# Patient Record
Sex: Female | Born: 1975 | Race: White | Hispanic: No | Marital: Married | Smoking: Former smoker
Health system: Southern US, Community
[De-identification: ages and names within clinical notes are randomized; demographics above are authoritative.]

## PROBLEM LIST (undated history)

## (undated) ENCOUNTER — Inpatient Hospital Stay (HOSPITAL_COMMUNITY): Payer: Self-pay

## (undated) DIAGNOSIS — K219 Gastro-esophageal reflux disease without esophagitis: Secondary | ICD-10-CM

## (undated) DIAGNOSIS — Z8619 Personal history of other infectious and parasitic diseases: Secondary | ICD-10-CM

## (undated) DIAGNOSIS — G478 Other sleep disorders: Secondary | ICD-10-CM

## (undated) DIAGNOSIS — K449 Diaphragmatic hernia without obstruction or gangrene: Secondary | ICD-10-CM

## (undated) DIAGNOSIS — T4145XA Adverse effect of unspecified anesthetic, initial encounter: Secondary | ICD-10-CM

## (undated) DIAGNOSIS — S199XXA Unspecified injury of neck, initial encounter: Secondary | ICD-10-CM

## (undated) DIAGNOSIS — K589 Irritable bowel syndrome without diarrhea: Secondary | ICD-10-CM

## (undated) DIAGNOSIS — Z8742 Personal history of other diseases of the female genital tract: Secondary | ICD-10-CM

## (undated) DIAGNOSIS — D219 Benign neoplasm of connective and other soft tissue, unspecified: Secondary | ICD-10-CM

## (undated) DIAGNOSIS — T8859XA Other complications of anesthesia, initial encounter: Secondary | ICD-10-CM

## (undated) HISTORY — DX: Irritable bowel syndrome, unspecified: K58.9

## (undated) HISTORY — PX: TONSILLECTOMY: SUR1361

## (undated) HISTORY — DX: Diaphragmatic hernia without obstruction or gangrene: K44.9

## (undated) HISTORY — DX: Unspecified injury of neck, initial encounter: S19.9XXA

## (undated) HISTORY — DX: Personal history of other diseases of the female genital tract: Z87.42

## (undated) HISTORY — DX: Personal history of other infectious and parasitic diseases: Z86.19

---

## 1995-10-23 HISTORY — PX: WISDOM TOOTH EXTRACTION: SHX21

## 1998-10-22 HISTORY — PX: BACK SURGERY: SHX140

## 1999-10-23 DIAGNOSIS — D219 Benign neoplasm of connective and other soft tissue, unspecified: Secondary | ICD-10-CM

## 1999-10-23 HISTORY — PX: MYOMECTOMY ABDOMINAL APPROACH: SUR870

## 1999-10-23 HISTORY — DX: Benign neoplasm of connective and other soft tissue, unspecified: D21.9

## 2000-10-22 HISTORY — PX: BACK SURGERY: SHX140

## 2009-10-22 DIAGNOSIS — G478 Other sleep disorders: Secondary | ICD-10-CM

## 2009-10-22 DIAGNOSIS — S199XXA Unspecified injury of neck, initial encounter: Secondary | ICD-10-CM

## 2009-10-22 HISTORY — DX: Other sleep disorders: G47.8

## 2009-10-22 HISTORY — PX: NECK SURGERY: SHX720

## 2009-10-22 HISTORY — DX: Unspecified injury of neck, initial encounter: S19.9XXA

## 2011-06-06 LAB — HIV ANTIBODY (ROUTINE TESTING W REFLEX): HIV: NONREACTIVE

## 2011-06-06 LAB — HEPATITIS B SURFACE ANTIGEN: Hepatitis B Surface Ag: NEGATIVE

## 2011-06-06 LAB — GC/CHLAMYDIA PROBE AMP, GENITAL
Chlamydia: NEGATIVE
Gonorrhea: NEGATIVE

## 2011-06-06 LAB — ANTIBODY SCREEN: Antibody Screen: NEGATIVE

## 2011-12-24 ENCOUNTER — Encounter (INDEPENDENT_AMBULATORY_CARE_PROVIDER_SITE_OTHER): Payer: BC Managed Care – PPO | Admitting: Obstetrics and Gynecology

## 2011-12-24 ENCOUNTER — Encounter: Payer: Self-pay | Admitting: Obstetrics and Gynecology

## 2011-12-24 ENCOUNTER — Other Ambulatory Visit: Payer: Self-pay

## 2011-12-24 ENCOUNTER — Other Ambulatory Visit (INDEPENDENT_AMBULATORY_CARE_PROVIDER_SITE_OTHER): Payer: BC Managed Care – PPO

## 2011-12-24 DIAGNOSIS — Z331 Pregnant state, incidental: Secondary | ICD-10-CM

## 2012-01-21 ENCOUNTER — Encounter (INDEPENDENT_AMBULATORY_CARE_PROVIDER_SITE_OTHER): Payer: BC Managed Care – PPO | Admitting: Obstetrics and Gynecology

## 2012-01-21 DIAGNOSIS — Z3400 Encounter for supervision of normal first pregnancy, unspecified trimester: Secondary | ICD-10-CM

## 2012-02-04 ENCOUNTER — Ambulatory Visit (INDEPENDENT_AMBULATORY_CARE_PROVIDER_SITE_OTHER): Payer: BC Managed Care – PPO | Admitting: Registered Nurse

## 2012-02-04 ENCOUNTER — Other Ambulatory Visit: Payer: Self-pay | Admitting: Registered Nurse

## 2012-02-04 ENCOUNTER — Observation Stay (HOSPITAL_COMMUNITY)
Admission: AD | Admit: 2012-02-04 | Discharge: 2012-02-04 | Disposition: A | Discharge status: Home / Self Care | Payer: BC Managed Care – PPO | Source: Ambulatory Visit | Attending: Obstetrics and Gynecology | Admitting: Obstetrics and Gynecology

## 2012-02-04 ENCOUNTER — Ambulatory Visit (INDEPENDENT_AMBULATORY_CARE_PROVIDER_SITE_OTHER): Payer: BC Managed Care – PPO

## 2012-02-04 ENCOUNTER — Encounter: Payer: Self-pay | Admitting: Registered Nurse

## 2012-02-04 ENCOUNTER — Other Ambulatory Visit: Payer: Self-pay | Admitting: Obstetrics and Gynecology

## 2012-02-04 ENCOUNTER — Encounter (HOSPITAL_COMMUNITY): Payer: Self-pay | Admitting: *Deleted

## 2012-02-04 ENCOUNTER — Other Ambulatory Visit: Payer: BC Managed Care – PPO

## 2012-02-04 VITALS — BP 122/84 | Wt 216.0 lb

## 2012-02-04 DIAGNOSIS — O26849 Uterine size-date discrepancy, unspecified trimester: Secondary | ICD-10-CM

## 2012-02-04 DIAGNOSIS — O368390 Maternal care for abnormalities of the fetal heart rate or rhythm, unspecified trimester, not applicable or unspecified: Secondary | ICD-10-CM | POA: Insufficient documentation

## 2012-02-04 DIAGNOSIS — N979 Female infertility, unspecified: Secondary | ICD-10-CM

## 2012-02-04 DIAGNOSIS — O3429 Maternal care due to uterine scar from other previous surgery: Secondary | ICD-10-CM

## 2012-02-04 DIAGNOSIS — O4700 False labor before 37 completed weeks of gestation, unspecified trimester: Principal | ICD-10-CM | POA: Insufficient documentation

## 2012-02-04 DIAGNOSIS — IMO0002 Reserved for concepts with insufficient information to code with codable children: Secondary | ICD-10-CM

## 2012-02-04 DIAGNOSIS — O09529 Supervision of elderly multigravida, unspecified trimester: Secondary | ICD-10-CM

## 2012-02-04 DIAGNOSIS — O9989 Other specified diseases and conditions complicating pregnancy, childbirth and the puerperium: Secondary | ICD-10-CM

## 2012-02-04 DIAGNOSIS — O479 False labor, unspecified: Secondary | ICD-10-CM | POA: Diagnosis present

## 2012-02-04 HISTORY — DX: Adverse effect of unspecified anesthetic, initial encounter: T41.45XA

## 2012-02-04 HISTORY — DX: Other complications of anesthesia, initial encounter: T88.59XA

## 2012-02-04 HISTORY — DX: Gastro-esophageal reflux disease without esophagitis: K21.9

## 2012-02-04 HISTORY — DX: Other sleep disorders: G47.8

## 2012-02-04 HISTORY — DX: Benign neoplasm of connective and other soft tissue, unspecified: D21.9

## 2012-02-04 LAB — US OB FOLLOW UP

## 2012-02-04 MED ORDER — DOCUSATE SODIUM 100 MG PO CAPS: 100.0000 mg | ORAL_CAPSULE | Freq: Every day | ORAL | Status: DC

## 2012-02-04 MED ORDER — PRENATAL MULTIVITAMIN CH: 1.0000 | ORAL_TABLET | Freq: Every day | ORAL | Status: DC

## 2012-02-04 MED ORDER — CALCIUM CARBONATE ANTACID 500 MG PO CHEW: 2.0000 | CHEWABLE_TABLET | ORAL | Status: DC | PRN

## 2012-02-04 MED ORDER — ZOLPIDEM TARTRATE 10 MG PO TABS: 10.0000 mg | ORAL_TABLET | Freq: Every evening | ORAL | Status: DC | PRN

## 2012-02-04 MED ORDER — ACETAMINOPHEN 325 MG PO TABS: 650.0000 mg | ORAL_TABLET | ORAL | Status: DC | PRN

## 2012-02-04 NOTE — H&P (Signed)
  History   36 yo G1P0 at 43 3/7 weeks presented after low FHR noted during [place] at the office.  [place] was done for S>D.  FHR had increased by the end of [place] to 120-130, but patient was sent to [facility] for prolonged monitoring.  Reported some cramping over the weekend.  Denies leaking, bleeding, and reports +FM.  Last IC last night.  Pregnancy remarkable for; IVF pregnancy Hx myomectomy with endometrial entry--for delivery by C/S AMA--Quad screen WNL   OB History    Grav Para Term Preterm Abortions TAB SAB Ect Mult Living   1              Medical History: Infertility, with this pregnancy via IVF; Hx fibroids, with myomectomy 2001 with endometrial entry, with plan for C/S for delivery.  IBS; Hiatal hernia; GERD; Neck injury 2011  Family History: Mother varicosities, Father blood clot; Sister IDDM; Cousin epilepsy; MGF Parkinson's. PGM stroke.  Sister fibromyalgia. PGM skin.  Surgical History: Back surgery 2002; Tonsils age 65; Wisdom teeth 47.  IVF.    History  Substance Use Topics  . Smoking status: Former Games developer  . Smokeless tobacco: Not on file  . Alcohol Use: No    Allergies: Codeine--vomiting, Keflex--rash   Physical Exam   Blood pressure 122/84, weight 216 lb (97.977 kg).  Chest clear Heart RRR without murmur Abd gravid, NT Pelvic--cervix closed, posterior, long, vtx -2 Ext WNL  FHR reactive, baseline initially 115-125 with accels, now 120-130 with accels.  One segment of negative spontaneous CST. Series of contractions when toco readjusted, q 5 min prior to voiding, now q 8+ after voiding, 50 sec.  Patient unaware of contractions.  ED Course  IUP at 32 3/7 weeks Fetal bradycardia on [place], but reactive tracing and segment of negative spontaneous CST AMA Hx myomectomy, with delivery by C/S planned Uterine activity  Plan: Consulted with Dr. Su Hilt regarding reactive FHR, presence of contractions. Will d/c home on PTL precautions, to return to office this week for  FFN and NST on Thursday. Plan urine culture--patient just voided, will obtain in the office. GBS done.  Nigel Bridgeman, CNM, [place] 02/03/12 3:50pm

## 2012-02-04 NOTE — Discharge Summary (Signed)
Physician Discharge Summary  Patient ID: Heather Odonnell MRN: [id number] DOB/AGE: 07/14/1976 36 y.o.  Admit date: 02/04/2012 Discharge date: 02/04/2012  Admission Diagnoses:  Fetal bradycardia on [place]                                           Preterm uterine activity                                            Discharge Diagnoses:  Active Problems:  Preterm contractions Reactive FHR Preterm uterine activity without cervical change  Discharged Condition: stable  Hospital Course: Sent from office at 31 1/7 weeks due to FHR of 100s on [place], BPP 6/8 (-2 FBM).  Monitored for 2 hours, with very reactive FHR throughout.  Did have a series of UCs q 5 minutes, resolved to q 8+ after voiding.  Patient reported IC within 24 hours, so no FFN done.  Cervix was closed, long, vtx -2 on exam, with GBS done.  Patient was d/c'd home per consult with Dr. Su Hilt, with plan for FFN later this week with NST, then BPP with visit next week.  PTL precautions were reviewed with patient and husband.  Consults: None  Significant Diagnostic Studies: None  Treatments: None  Discharge Exam: Blood pressure 126/88, pulse 100, temperature 98.3 F (36.8 C), temperature source Oral, resp. rate 18, height 5\' 6"  (1.676 m), weight 216 lb (97.977 kg). General appearance: alert Chest wall: no tenderness GI: Abd gravid, NT Pelvic: Cervix closed, long, vtx -2 Extremities: extremities normal, atraumatic, no cyanosis or edema FHR reactive.  Disposition: Home   Follow-up:  Thursday, 4/18, 2013 for NST and FFN, urine culture.  Medication List  As of 02/04/2012  7:15 PM   TAKE these medications         betamethasone dipropionate 0.05 % cream   Commonly known as: DIPROLENE      loratadine 10 MG tablet   Commonly known as: CLARITIN   Take 10 mg by mouth at bedtime.      omeprazole 20 MG capsule   Commonly known as: PRILOSEC   Take 20 mg by mouth daily.      prenatal multivitamin Tabs   Take 1 tablet by mouth at  bedtime.           Follow-up Information    Follow up with [facility] on 02/07/2012. (Office will call to schedule visit for FFN and NST.)          Signed: Nigel Bridgeman 02/04/2012, 7:15 PM

## 2012-02-04 NOTE — Discharge Instructions (Signed)

## 2012-02-04 NOTE — Progress Notes (Signed)
Patient for u/s today re: s>d. U/S [facility] 03/15/2012. EFW 5 lbs. 6 oz. 85% tile. AFI 17.5 cm. Cx 3.08 cm BPP 6/8. Vertex presentation. Posterior grade 1 placenta. Normal amniotic fluid. Fetal heart rate slowed to 112bpm-119bpm. Time line bpm 2:29pm- 119 bpm, 3:01pm= 112bpm, 3:06= 135bpm, 3:07pm 154 bpm. BPP 6/8 in 30 minutes. Cx closed measured translabially. After consulting with AR patient to [facility]U for prolonged monitoring.Understanding of instructions verbalized.

## 2012-02-05 ENCOUNTER — Other Ambulatory Visit: Payer: Self-pay | Admitting: Obstetrics and Gynecology

## 2012-02-05 ENCOUNTER — Telehealth: Payer: Self-pay | Admitting: Obstetrics and Gynecology

## 2012-02-05 NOTE — Telephone Encounter (Signed)
TC to pt.  Informed of appts sched per VL.  Pt verbalizes comprehension.

## 2012-02-06 ENCOUNTER — Other Ambulatory Visit: Payer: Self-pay | Admitting: Obstetrics and Gynecology

## 2012-02-06 NOTE — Progress Notes (Signed)
Error

## 2012-02-06 NOTE — H&P (Deleted)
36 yo G1P0 at 32 3/7 weeks presented after low FHR noted during [place] at the office. [place] was done for S>D. FHR had increased by the end of [place] to 120-130, but patient was sent to [facility] for prolonged monitoring. Reported some cramping over the weekend. Denies leaking, bleeding, and reports +FM. Last IC last night.  Pregnancy remarkable for;  IVF pregnancy  Hx myomectomy with endometrial entry--for delivery by C/S  AMA--Quad screen WNL  OB History    Grav  Para  Term  Preterm  Abortions  TAB  SAB  Ect  Mult  Living    1              Medical History:  Infertility, with this pregnancy via IVF; Hx fibroids, with myomectomy 2001 with endometrial entry, with plan for C/S for delivery. IBS; Hiatal hernia; GERD; Neck injury 2011  Family History:  Mother varicosities, Father blood clot; Sister IDDM; Cousin epilepsy; MGF Parkinson's. PGM stroke. Sister fibromyalgia. PGM skin.  Surgical History:  Back surgery 2002; Tonsils age 28; Wisdom teeth 56. IVF.  History   Substance Use Topics   .  Smoking status:  Former Games developer   .  Smokeless tobacco:  Not on file   .  Alcohol Use:  No   Allergies: Codeine--vomiting, Keflex--rash  Physical Exam   Blood pressure 122/84, weight 216 lb (97.977 kg).  Chest clear  Heart RRR without murmur  Abd gravid, NT  Pelvic--cervix closed, posterior, long, vtx -2  Ext WNL  FHR reactive, baseline initially 115-125 with accels, now 120-130 with accels. One segment of negative spontaneous CST.  Series of contractions when toco readjusted, q 5 min prior to voiding, now q 8+ after voiding, 50 sec. Patient unaware of contractions.  ED Course   IUP at 32 3/7 weeks  Fetal bradycardia on [place], but reactive tracing and segment of negative spontaneous CST  AMA  Hx myomectomy, with delivery by C/S planned  Uterine activity  Plan:  Consulted with Dr. Su Hilt regarding reactive FHR, presence of contractions.  Will d/c home on PTL precautions, to return to office this week for FFN and  NST on Thursday.  Plan urine culture--patient just voided, will obtain in the office.  GBS done.  Nigel Bridgeman, CNM, [place]  02/03/12 3:50pm

## 2012-02-06 NOTE — H&P (Signed)
36 yo G1P0 at 32 3/7 weeks presented after low FHR noted during US at the office. US was done for S>D. FHR had increased by the end of US to 120-130, but [facility]. Reported some cramping over the weekend. Denies leaking, bleeding, and reports +FM. Last IC last night.  Pregnancy remarkable for;  IVF pregnancy  Hx myomectomy with endometrial entry--for delivery by C/S  AMA--Quad screen WNL  OB History    Grav  Para  Term  Preterm  Abortions  TAB  SAB  Ect  Mult  Living    1              Medical History:  Infertility, with this pregnancy via IVF; Hx fibroids, with myomectomy 2001 with endometrial entry, with plan for C/S for delivery. IBS; Hiatal hernia; GERD; Neck injury 2011  Family History:  Mother varicosities, Father blood clot; Sister IDDM; Cousin epilepsy; MGF Parkinson's. PGM stroke. Sister fibromyalgia. PGM skin.  Surgical History:  Back surgery 2002; Tonsils age 6; Wisdom teeth 1997. IVF.  History   Substance Use Topics   .  Smoking status:  Former Smoker   .  Smokeless tobacco:  Not on file   .  Alcohol Use:  No   Allergies: Codeine--vomiting, Keflex--rash  Physical Exam   Blood pressure 122/84, weight 216 lb (97.977 kg).  Chest clear  Heart RRR without murmur  Abd gravid, NT  Pelvic--cervix closed, posterior, long, vtx -2  Ext WNL  FHR reactive, baseline initially 115-125 with accels, now 120-130 with accels. One segment of negative spontaneous CST.  Series of contractions when toco readjusted, q 5 min prior to voiding, now q 8+ after voiding, 50 sec. Patient unaware of contractions.  ED Course   IUP at 32 3/7 weeks  Fetal bradycardia on US, but reactive tracing and segment of negative spontaneous CST  AMA  Hx myomectomy, with delivery by C/S planned  Uterine activity  Plan:  Consulted with Dr. Roberts regarding reactive FHR, presence of contractions.  Will d/c home on PTL precautions, to return to office this week for FFN and  NST on Thursday.  Plan urine culture--patient just voided, will obtain in the office.  GBS done.  Elysia Grand, CNM, [place]  02/03/12 3:50pm   

## 2012-02-07 ENCOUNTER — Encounter: Payer: Self-pay | Admitting: Obstetrics and Gynecology

## 2012-02-07 ENCOUNTER — Ambulatory Visit: Payer: BC Managed Care – PPO | Admitting: Obstetrics and Gynecology

## 2012-02-07 ENCOUNTER — Ambulatory Visit (INDEPENDENT_AMBULATORY_CARE_PROVIDER_SITE_OTHER): Payer: BC Managed Care – PPO | Admitting: Obstetrics and Gynecology

## 2012-02-07 VITALS — BP 120/84 | Wt 216.0 lb

## 2012-02-07 DIAGNOSIS — Z331 Pregnant state, incidental: Secondary | ICD-10-CM

## 2012-02-07 DIAGNOSIS — O4700 False labor before 37 completed weeks of gestation, unspecified trimester: Secondary | ICD-10-CM

## 2012-02-07 DIAGNOSIS — O2690 Pregnancy related conditions, unspecified, unspecified trimester: Secondary | ICD-10-CM

## 2012-02-07 LAB — CULTURE, BETA STREP (GROUP B ONLY)

## 2012-02-07 LAB — OB RESULTS CONSOLE GBS: GBS: NEGATIVE

## 2012-02-07 MED ORDER — CYCLOBENZAPRINE HCL 10 MG PO TABS: 10.0000 mg | ORAL_TABLET | Freq: Every evening | ORAL | Status: DC | PRN

## 2012-02-07 NOTE — Progress Notes (Signed)
Pt f/u from hospital visit 02/04/12.  NST reactive. FFN done.  C/o constant crampiness and back pain. Sleeping poorly because of hip pain.  Rec Flexeril Concerned that she won't know when labor starts and she needs a c-section to avoid labor.  Discussed the possibility of amniocentesis for lung maturity to plan csection earlier than 39 weeks.  Written info given. They will consider BPP NV

## 2012-02-07 NOTE — Patient Instructions (Signed)
Amniocentesis Amniocentesis (amnio) is the removal of a small amount of fluid that surrounds the baby in the amniotic sac. Once the fluid is removed, it may be examined to find answers to a number of serious questions. An amnio is often done early in pregnancy (between 37 and 17 weeks) to determine if there is a complication with the baby, or it is done later in pregnancy (between 77 and 37 weeks) to see if the baby's lungs are mature. Amnios that are done later in pregnancy are often done to help weigh the risks and benefits of a needed early delivery. Amnios done early in the second trimester are commonly referred to as genetic amnios, as they are typically done to check for a potential life-altering genetic abnormality. Rarely, an amnio is done to evaluate the amniotic fluid for concerns of infection. Amniocentesis may be done for other reasons, including:   If the mother is 58 years old or older. This is because of the increased risk of chromosome abnormalities, such as Down's syndrome or various chromosomal trisomies.   To determine any genetic problems.   To look for signs of infection in the uterus.   To determine if the baby's lungs are mature enough for the baby to survive outside of the uterus. This information is important in pregnancies when the baby must be delivered early.  LET YOUR CAREGIVER KNOW ABOUT:  Any complications you have had with the pregnancy, such as bleeding or contractions.   Allergies.   Medicines taken, including vitamins, herbs, eyedrops, over-the-counter medicines, and creams.   Use of steroids (by mouth or creams).   Previous problems with numbing medicines.   History of bleeding problems or blood clots.   Previous surgery.   Other health problems.  RISKS AND COMPLICATIONS  Complications can include:  Vaginal bleeding.   Transmission of an infection from mother to baby, such as hepatitis C or other viruses.   Leaking of amniotic fluid.    Premature labor.   Fetal injury.   Injury to the placenta.   Miscarriage (rare).  This procedure is done only if your caregiver feels the information obtained from the procedure justifies the risk. Amnios should not be performed before 15 weeks of pregnancy unless it is absolutely necessary. BEFORE THE PROCEDURE   Ask your caregiver any questions you may have.   Eat as usual.   Drink enough fluids to keep your urine clear or pale yellow.   You may want to arrange for someone to drive you home after the procedure.  PROCEDURE  A careful ultrasound is done to evaluate the baby for its position and for where the best pockets of fluid lie. The mother's abdomen is prepped with a solution to prevent infections. Often, a sterile ultrasound probe is used to watch the location of the amniocentesis needle being used. A small spot of the skin may be injected with a numbing medicine. In that location, a longer needle is introduced through the skin and down to the level of the baby. Amniotic fluid is removed into a syringe and sent to the lab for evaluation. AFTER THE PROCEDURE   Ask your caregiver if you need a RhoGam shot.   You will rest for 1 to 2 hours for observation.   Your baby will be placed on a monitor for 1 to 2 hours to see if there are any problems.   You may develop cramping and a small amount of vaginal bleeding right after the amnio.  Ask when your test results will be ready. Make sure you get your test results.  Document Released: 10/05/2000 Document Revised: 09/27/2011 Document Reviewed: 08/13/2011 [facility] Patient Information 2012 [place], [place].

## 2012-02-11 ENCOUNTER — Encounter (HOSPITAL_COMMUNITY): Payer: Self-pay | Admitting: *Deleted

## 2012-02-11 ENCOUNTER — Inpatient Hospital Stay (HOSPITAL_COMMUNITY)
Admission: AD | Admit: 2012-02-11 | Discharge: 2012-02-11 | Disposition: A | Discharge status: Home / Self Care | Payer: BC Managed Care – PPO | Source: Ambulatory Visit | Attending: Obstetrics and Gynecology | Admitting: Obstetrics and Gynecology

## 2012-02-11 ENCOUNTER — Telehealth: Payer: Self-pay | Admitting: Obstetrics and Gynecology

## 2012-02-11 DIAGNOSIS — M259 Joint disorder, unspecified: Secondary | ICD-10-CM

## 2012-02-11 DIAGNOSIS — O368390 Maternal care for abnormalities of the fetal heart rate or rhythm, unspecified trimester, not applicable or unspecified: Secondary | ICD-10-CM | POA: Insufficient documentation

## 2012-02-11 DIAGNOSIS — N979 Female infertility, unspecified: Secondary | ICD-10-CM

## 2012-02-11 DIAGNOSIS — O3429 Maternal care due to uterine scar from other previous surgery: Secondary | ICD-10-CM

## 2012-02-11 DIAGNOSIS — O09529 Supervision of elderly multigravida, unspecified trimester: Secondary | ICD-10-CM

## 2012-02-11 DIAGNOSIS — O9989 Other specified diseases and conditions complicating pregnancy, childbirth and the puerperium: Secondary | ICD-10-CM

## 2012-02-11 DIAGNOSIS — O479 False labor, unspecified: Secondary | ICD-10-CM

## 2012-02-11 DIAGNOSIS — M549 Dorsalgia, unspecified: Secondary | ICD-10-CM

## 2012-02-11 DIAGNOSIS — M899 Disorder of bone, unspecified: Secondary | ICD-10-CM

## 2012-02-11 DIAGNOSIS — IMO0002 Reserved for concepts with insufficient information to code with codable children: Secondary | ICD-10-CM

## 2012-02-11 LAB — URINALYSIS, ROUTINE W REFLEX MICROSCOPIC
Bilirubin Urine: NEGATIVE
Glucose, UA: NEGATIVE mg/dL
Ketones, ur: NEGATIVE mg/dL
Leukocytes, UA: NEGATIVE
Nitrite: NEGATIVE
Protein, ur: NEGATIVE mg/dL
Specific Gravity, Urine: <1.005 — ABNORMAL LOW (ref 1.005–1.030)
Urobilinogen, UA: 0.2 mg/dL (ref 0.0–1.0)
pH: 6 (ref 5.0–8.0)

## 2012-02-11 NOTE — Telephone Encounter (Signed)
PT CALLED, IS 33 WKS, HAD APPT LAST THURS AND HAD A LOT OF CRAMPING WHILE IN THE OFFICE, HAD A BPP AND WAS THEN SENT TO [facility], PT WAS ON MONITOR WHILE @ [facility], STRIP DID SHOW PT CTXS, WAS RX'D FLEXERIL, PT SAYS CRAMPING HAS CONTINUED THRU THE WKND, FLEXERIL ONLY LASTS 3-4 HRS THEN PAIN COMES BACK, TODAY PAIN ISGETTING WORSE, PAIN IS CONSTANT, PT DENIES BLDG/LOF, IS DRINKING LOTS OF H20, PER MK PT TO [facility] FOR EVAL.

## 2012-02-11 NOTE — Telephone Encounter (Deleted)
Routed to triage 

## 2012-02-11 NOTE — Discharge Instructions (Signed)
Back Pain in Pregnancy  Back pain during pregnancy is common. It happens in about half of all pregnancies. It is important for you and your baby that you remain active during your pregnancy.If you feel that back pain is not allowing you to remain active or sleep well, it is time to see your caregiver. Back pain may be caused by several factors related to changes during your pregnancy.Fortunately, unless you had trouble with your back before your pregnancy, the pain is likely to get better after you deliver.  Low back pain usually occurs between the fifth and seventh months of pregnancy. It can, however, happen in the first couple months. Factors that increase the risk of back problems include:    Previous back problems.   Injury to your back.   Having twins or multiple births.   A chronic cough.   Stress.   Job-related repetitive motions.   Muscle or spinal disease in the back.   Family history of back problems, ruptured (herniated) discs, or osteoporosis.   Depression, anxiety, and panic attacks.  CAUSES    When you are pregnant, your body produces a hormone called relaxin. This hormonemakes the ligaments connecting the low back and pubic bones more flexible. This flexibility allows the baby to be delivered more easily. When your ligaments are loose, your muscles need to work harder to support your back. Soreness in your back can come from tired muscles. Soreness can also come from back tissues that are irritated since they are receiving less support.   As the baby grows, it puts pressure on the nerves and blood vessels in your pelvis. This can cause back pain.   As the baby grows and gets heavier during pregnancy, the uterus pushes the stomach muscles forward and changes your center of gravity. This makes your back muscles work harder to maintain good posture.  SYMPTOMS   Lumbar pain during pregnancy  Lumbar pain during pregnancy usually occurs at or above the waist in the center of the back. There  may be pain and numbness that radiates into your leg or foot. This is similar to low back pain experienced by non-pregnant women. It usually increases with sitting for long periods of time, standing, or repetitive lifting. Tenderness may also be present in the muscles along your upper back.  Posterior pelvic pain during pregnancy  Pain in the back of the pelvis is more common than lumbar pain in pregnancy. It is a deep pain felt in your side at the waistline, or across the tailbone (sacrum), or in both places. You may have pain on one or both sides. This pain can also go into the buttocks and backs of the upper thighs. Pubic and groin pain may also be present. The pain does not quickly resolve with rest, and morning stiffness may also be present.  Pelvic pain during pregnancy can be brought on by most activities. A high level of fitness before and during pregnancy may or may not prevent this problem. Labor pain is usually 1 to 2 minutes apart, lasts for about 1 minute, and involves a bearing down feeling or pressure in your pelvis. However, if you are at term with the pregnancy, constant low back pain can be the beginning of early labor, and you should be aware of this.  DIAGNOSIS   X-rays of the back should not be done during the first 12 to 14 weeks of the pregnancy and only when absolutely necessary during the rest of the pregnancy. MRIs do   not give off radiation and are safe during pregnancy. MRIs also should only be done when absolutely necessary.  HOME CARE INSTRUCTIONS   Exercise as directed by your caregiver. Exercise is the most effective way to prevent or manage back pain. If you have a back problem, it is especially important to avoid sports that require sudden body movements. Swimming and walking are great activities.   Do not stand in one place for long periods of time.   Do not wear high heels.   Sit in chairs with good posture. Use a pillow on your lower back if necessary. Make sure your head  rests over your shoulders and is not hanging forward.   Try sleeping on your side, preferably the left side, with a pillow or two between your legs. If you are sore after a night's rest, your bedmay betoo soft.Try placing a board between your mattress and box spring.   Listen to your body when lifting.If you are experiencing pain, ask for help or try bending yourknees more so you can use your leg muscles rather than your back muscles. Squat down when picking up something from the floor. Do not bend over.   Eat a healthy diet. Try to gain weight within your caregiver's recommendations.   Use heat or cold packs 3 to 4 times a day for 15 minutes to help with the pain.   Only take over-the-counter or prescription medicines for pain, discomfort, or fever as directed by your caregiver.  Sudden (acute) back pain   Use bed rest for only the most extreme, acute episodes of back pain. Prolonged bed rest over 48 hours will aggravate your condition.   Ice is very effective for acute conditions.   Put ice in a plastic bag.   Place a towel between your skin and the bag.   Leave the ice on for 10 to 20 minutes every 2 hours, or as needed.   Using heat packs for 30 minutes prior to activities is also helpful.  Continued back pain  See your caregiver if you have continued problems. Your caregiver can help or refer you for appropriate physical therapy. With conditioning, most back problems can be avoided. Sometimes, a more serious issue may be the cause of back pain. You should be seen right away if new problems seem to be developing. Your caregiver may recommend:   A maternity girdle.   An elastic sling.   A back brace.   A massage therapist or acupuncture.  SEEK MEDICAL CARE IF:    You are not able to do most of your daily activities, even when taking the pain medicine you were given.   You need a referral to a physical therapist or chiropractor.   You want to try acupuncture.  SEEK IMMEDIATE MEDICAL CARE  IF:   You develop numbness, tingling, weakness, or problems with the use of your arms or legs.   You develop severe back pain that is no longer relieved with medicines.   You have a sudden change in bowel or bladder control.   You have increasing pain in other areas of the body.   You develop shortness of breath, dizziness, or fainting.   You develop nausea, vomiting, or sweating.   You have back pain which is similar to labor pains.   You have back pain along with your water breaking or vaginal bleeding.   You have back pain or numbness that travels down your leg.   Your back pain developed after   kidney stone.   You see blood in your urine. You may have a bladder infection or kidney stone.   You have back pain with blisters. You may have shingles.  Back pain is fairly common during pregnancy but should not be accepted as just part of the process. Back pain should always be treated as soon as possible. This will make your pregnancy as pleasant as possible. Document Released: 01/16/2006 Document Revised: 09/27/2011 Document Reviewed: 02/27/2011 Preterm Labor Preterm labor is when labor starts at less than 37 weeks of pregnancy. The normal length of a pregnancy is 39 to 41 weeks. CAUSES Often, there is no identifiable underlying cause as to why a woman goes into preterm labor. However, one of the most common known causes of preterm labor is infection. Infections of the uterus, cervix, vagina, amniotic sac, bladder, kidney, or even the lungs (pneumonia) can cause labor to start. Other causes of preterm labor include: Urogenital infections, such as yeast infections and bacterial vaginosis.  Uterine abnormalities (uterine shape, uterine septum, fibroids, bleeding from the placenta).  A cervix that has been operated on and opens prematurely.  Malformations in the baby.  Multiple  gestations (twins, triplets, and so on).  Breakage of the amniotic sac.  Additional risk factors for preterm labor include: Previous history of preterm labor.  Premature rupture of membranes (PROM).  A placenta that covers the opening of the cervix (placenta previa).  A placenta that separates from the uterus (placenta abruption).  A cervix that is too weak to hold the baby in the uterus (incompetence cervix).  Having too much fluid in the amniotic sac (polyhydramnios).  Taking illegal drugs or smoking while pregnant.  Not gaining enough weight while pregnant.  Women younger than 19 and older than 37 years old.  Low socioeconomic status.  African-American ethnicity.  SYMPTOMS Signs and symptoms of preterm labor include: Menstrual-like cramps.  Contractions that are 30 to 70 seconds apart, become very regular, closer together, and are more intense and painful.  Contractions that start on the top of the uterus and spread down to the lower abdomen and back.  A sense of increased pelvic pressure or back pain.  A watery or bloody discharge that comes from the vagina.  DIAGNOSIS  A diagnosis can be confirmed by: A vaginal exam.  An ultrasound of the cervix.  Sampling (swabbing) cervico-vaginal secretions. These samples can be tested for the presence of fetal fibronectin. This is a protein found in cervical discharge which is associated with preterm labor.  Fetal monitoring.  TREATMENT  Depending on the length of the pregnancy and other circumstances, a caregiver may suggest bed rest. If necessary, there are medicines that can be given to stop contractions and to quicken fetal lung maturity. If labor happens before 34 weeks of pregnancy, a prolonged hospital stay may be recommended. Treatment depends on the condition of both the mother and baby. PREVENTION There are some things a mother can do to lower the risk of preterm labor in future pregnancies. A woman can:  Stop smoking.  Maintain  healthy weight gain and avoid chemicals and drugs that are not necessary.  Be watchful for any type of infection.  Inform her caregiver if she has a known history of preterm labor.  Document Released: 12/29/2003 Document Revised: 09/27/2011 Document Reviewed: 02/02/2011 [facility] Patient Information 2012 [place], [place].

## 2012-02-11 NOTE — Progress Notes (Signed)
History    C/o of crampy back pain for  1 1/2 weeks relief x 3 hours with flexeril use at night makes me so sleepy, denies srom or vag bleeding with +FM. Hx back and neck surgery. No V,D, constipation or UTI s/s with some nausea drinking lots of water. +FM.  CSN: [id number]  Arrival date & time 02/11/12  1324   None     Chief Complaint  Patient presents with  . Abdominal Pain    HPI  Past Medical History  Diagnosis Date  . Difficulty waking 2011    difficulty waking up  . GERD (gastroesophageal reflux disease)   . Complication of anesthesia     trouble waking up at last surgery  . Fibroid 2001  . H/O varicella     Past Surgical History  Procedure Date  . Myomectomy abdominal approach 2001  . Tonsillectomy   . Neck surgery 2011  . Back surgery 2002    Family History  Problem Relation Age of Onset  . Diabetes Sister   . Cancer Paternal Grandmother   . Stroke Paternal Grandfather     History  Substance Use Topics  . Smoking status: Former Games developer  . Smokeless tobacco: Never Used  . Alcohol Use: No    OB History    Grav Para Term Preterm Abortions TAB SAB Ect Mult Living   1               Review of Systems  Allergies  Codeine and Keflex  Home Medications  No current outpatient prescriptions on file.  BP 119/77  Pulse 105  Temp(Src) 97.8 F (36.6 C) (Oral)  Resp 16  Ht 5\' 7"  (1.702 m)  Wt 215 lb (97.523 kg)  BMI 33.67 kg/m2  Physical Exam abd soft, gravid, nt, vag LTC Fhts NST reactive toco slight irritability Vag LTC Neg FFN 4/17  GBS neg  MAU Course  Procedures (including critical care time)  Labs Reviewed  URINALYSIS, ROUTINE W REFLEX MICROSCOPIC - Abnormal; Notable for the following:    APPearance HAZY (*)    Specific Gravity, Urine <1.005 (*)    Hgb urine dipstick TRACE (*)    All other components within normal limits  URINE MICROSCOPIC-ADD ON - Abnormal; Notable for the following:    Squamous Epithelial / LPF MANY (*)    All  other components within normal limits   No results found.   1. AMA (advanced maternal age) multigravida 35+   2. Pregnancy w/ hx of uterine myomectomy   3. Infertility, female, primary   4. Abnormal fetal heart rate or rhythm (FHR)   5. Preterm contractions   6. Chronic back pain    33 week IUP Back pain Not in PTL Hx of myomectomy for primary C/S P discussed with Dr. Estanislado Pandy per telephone flexeril q 6 hours, exercise for back pain, heat, ice, baby hugger belt discussed f/o as scheduled. Lavera Guise, CNM   MDM

## 2012-02-11 NOTE — MAU Note (Signed)
Constant cramp in abd, ongoing, getting worse.  Started a week ago Sat. Denies bleeding or leaking.  Nausea, diarrhea. Denies urinary problems.  No fever.

## 2012-02-13 NOTE — Telephone Encounter (Signed)
Routed to jackie  

## 2012-02-13 NOTE — Progress Notes (Signed)
Post ur discharge review completed.  

## 2012-02-13 NOTE — Telephone Encounter (Deleted)
Routed to angela frye

## 2012-02-15 ENCOUNTER — Telehealth: Payer: Self-pay

## 2012-02-15 ENCOUNTER — Encounter: Payer: Self-pay | Admitting: Obstetrics and Gynecology

## 2012-02-15 ENCOUNTER — Ambulatory Visit (INDEPENDENT_AMBULATORY_CARE_PROVIDER_SITE_OTHER): Payer: BC Managed Care – PPO

## 2012-02-15 ENCOUNTER — Ambulatory Visit (INDEPENDENT_AMBULATORY_CARE_PROVIDER_SITE_OTHER): Payer: BC Managed Care – PPO | Admitting: Obstetrics and Gynecology

## 2012-02-15 VITALS — BP 110/78 | Wt 218.0 lb

## 2012-02-15 DIAGNOSIS — O3429 Maternal care due to uterine scar from other previous surgery: Secondary | ICD-10-CM

## 2012-02-15 DIAGNOSIS — N979 Female infertility, unspecified: Secondary | ICD-10-CM

## 2012-02-15 DIAGNOSIS — O9989 Other specified diseases and conditions complicating pregnancy, childbirth and the puerperium: Secondary | ICD-10-CM

## 2012-02-15 DIAGNOSIS — O09529 Supervision of elderly multigravida, unspecified trimester: Secondary | ICD-10-CM

## 2012-02-15 NOTE — Progress Notes (Signed)
C/O lots of cramps  Last FFN negative 1 week ago  Improves with Flexeril No contractions No change in discharge BPP 8/8 AFI normal

## 2012-02-15 NOTE — Telephone Encounter (Signed)
Per pt request, and Dr. Cloretta Ned request, I faxed Rx for breast feeding supplies to [facility] at [phone]. A message had been routed to AR on 02/11/2012.Melody Comas A

## 2012-02-19 NOTE — Telephone Encounter (Signed)
I have no idea with this is about.  Please look into what they are needing and let me know. Thanks, AYR

## 2012-02-20 ENCOUNTER — Telehealth: Payer: Self-pay

## 2012-02-20 ENCOUNTER — Inpatient Hospital Stay (HOSPITAL_COMMUNITY): Admission: AD | Admit: 2012-02-20 | Payer: Self-pay | Source: Ambulatory Visit | Admitting: Obstetrics and Gynecology

## 2012-02-20 NOTE — Telephone Encounter (Signed)
Annice Pih, I thought you took care of this?

## 2012-02-20 NOTE — Telephone Encounter (Signed)
Spoke to pt. She states she got the Breast feeding supply kit in the mail from [facility]. Melody Comas A

## 2012-02-21 ENCOUNTER — Encounter: Payer: Self-pay | Admitting: Obstetrics and Gynecology

## 2012-02-21 ENCOUNTER — Ambulatory Visit (INDEPENDENT_AMBULATORY_CARE_PROVIDER_SITE_OTHER): Payer: BC Managed Care – PPO | Admitting: Obstetrics and Gynecology

## 2012-02-21 VITALS — BP 120/70 | Wt 221.0 lb

## 2012-02-21 DIAGNOSIS — Z3400 Encounter for supervision of normal first pregnancy, unspecified trimester: Secondary | ICD-10-CM

## 2012-02-21 DIAGNOSIS — O26849 Uterine size-date discrepancy, unspecified trimester: Secondary | ICD-10-CM

## 2012-02-21 NOTE — Progress Notes (Signed)
C/o left arm and ankle discomfort and LLE swelling BPP at [place] for S>D EFW in 2wks for S>D FKCs Bil Ted Hose Carpal Tunnel Syndrome - rec bil wrist splints Questions answered

## 2012-02-21 NOTE — Telephone Encounter (Signed)
This is sr pt 

## 2012-02-22 NOTE — Telephone Encounter (Signed)
LM for pt that breastfeeding supply rx needs to come from pediatricians office.  (per SR previous discussions)  Pt to call back if any further questions.  ld

## 2012-02-25 ENCOUNTER — Encounter (HOSPITAL_COMMUNITY): Payer: Self-pay | Admitting: *Deleted

## 2012-02-25 ENCOUNTER — Inpatient Hospital Stay (HOSPITAL_COMMUNITY)
Admission: AD | Admit: 2012-02-25 | Discharge: 2012-02-25 | Disposition: A | Discharge status: Home / Self Care | Payer: BC Managed Care – PPO | Source: Ambulatory Visit | Attending: Obstetrics and Gynecology | Admitting: Obstetrics and Gynecology

## 2012-02-25 ENCOUNTER — Inpatient Hospital Stay (HOSPITAL_COMMUNITY): Payer: BC Managed Care – PPO

## 2012-02-25 DIAGNOSIS — O479 False labor, unspecified: Secondary | ICD-10-CM

## 2012-02-25 DIAGNOSIS — N979 Female infertility, unspecified: Secondary | ICD-10-CM

## 2012-02-25 DIAGNOSIS — O09529 Supervision of elderly multigravida, unspecified trimester: Secondary | ICD-10-CM | POA: Insufficient documentation

## 2012-02-25 DIAGNOSIS — O368390 Maternal care for abnormalities of the fetal heart rate or rhythm, unspecified trimester, not applicable or unspecified: Secondary | ICD-10-CM | POA: Insufficient documentation

## 2012-02-25 DIAGNOSIS — O3429 Maternal care due to uterine scar from other previous surgery: Secondary | ICD-10-CM

## 2012-02-25 DIAGNOSIS — IMO0002 Reserved for concepts with insufficient information to code with codable children: Secondary | ICD-10-CM

## 2012-02-25 DIAGNOSIS — O4690 Antepartum hemorrhage, unspecified, unspecified trimester: Secondary | ICD-10-CM | POA: Insufficient documentation

## 2012-02-25 DIAGNOSIS — O4693 Antepartum hemorrhage, unspecified, third trimester: Secondary | ICD-10-CM

## 2012-02-25 NOTE — Discharge Instructions (Signed)
Preventing Preterm Labor Preterm labor is when a pregnant woman has contractions that cause the cervix to open, shorten, and thin before 37 weeks of pregnancy. You will have regular contractions (tightening) 2 to 3 minutes apart. This usually causes discomfort or pain. HOME CARE  Eat a healthy diet.   Take your vitamins as told by your doctor.   Drink enough fluids to keep your pee (urine) clear or pale yellow every day.   Get rest and sleep.   Do not have sex if you are at high risk for preterm labor.   Follow your doctor's advice about activity, medicines, and tests.   Avoid stress.   Avoid hard labor or exercise that lasts for a long time.   Do not smoke.  GET HELP RIGHT AWAY IF:   You are having contractions.   You have belly (abdominal) pain.   You have bleeding from your vagina.   You have pain when you pee (urinate).   You have abnormal discharge from your vagina.   You have a temperature by mouth above 102 F (38.9 C).  MAKE SURE YOU:  Understand these instructions.   Will watch your condition.   Will get help if you are not doing well or get worse.  Document Released: 01/04/2009 Document Revised: 09/27/2011 Document Reviewed: 01/04/2009 [employer] Patient Information 2012 [employer]. 

## 2012-02-25 NOTE — MAU Note (Signed)
TAKES FLEXERIL AND TYLENOL FOR CRAMPS.  HAD SEX ON SAT AT 5PM

## 2012-02-25 NOTE — MAU Note (Signed)
PT SAYS AT 0115  SHE GOT UP TO B-ROOM-  HAD A LARGE AMT BLEEDING.   IN TRIAGE -  PAD HAD  LIGHT RED - SMALL  STREAK OF BLOOD.

## 2012-02-25 NOTE — MAU Note (Signed)
FEELS CRAMPING- X4 WEEKS- TOLD IN OFFICE-  CERVIX CLOSED IN OFFICE.  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

## 2012-02-27 ENCOUNTER — Ambulatory Visit (INDEPENDENT_AMBULATORY_CARE_PROVIDER_SITE_OTHER): Payer: BC Managed Care – PPO

## 2012-02-27 VITALS — BP 100/70 | Wt 224.0 lb

## 2012-02-27 DIAGNOSIS — IMO0002 Reserved for concepts with insufficient information to code with codable children: Secondary | ICD-10-CM | POA: Insufficient documentation

## 2012-02-27 DIAGNOSIS — O26849 Uterine size-date discrepancy, unspecified trimester: Secondary | ICD-10-CM

## 2012-02-27 DIAGNOSIS — O4693 Antepartum hemorrhage, unspecified, third trimester: Secondary | ICD-10-CM | POA: Insufficient documentation

## 2012-02-27 DIAGNOSIS — O4690 Antepartum hemorrhage, unspecified, unspecified trimester: Secondary | ICD-10-CM

## 2012-02-27 NOTE — Progress Notes (Signed)
Addendum to 5/8 note:  U/s today for BPP:  AFI=17.6 (65%); BPP 8/8.  Vtx; Rt lat Plac Grade 1.  Cx=2.5 cm; closed (measured TL). Husband w/ concerns about transport to hospital since live in [place], [place] if spont labor before 39 weeks; labor s/s rev'd.

## 2012-02-27 NOTE — Progress Notes (Signed)
MAU early Mon morning 5/6 for Vaginal bleeding and u/s WNL and NST.  Pt reports no bleeding today or yesterday; spotty w/ wiping Mon after eval.  Pt on pelvic rest.  U/s w/ persisting LGA.  No PTL s/s.  GFM.  Husband at appt and involved and supportive.  Reports c/s not yet scheduled. Has appt already scheduled w/ Dr. Su Hilt next week w/ f/u u/s.  GBS [place].

## 2012-02-27 NOTE — Progress Notes (Signed)
Bleeding has subsided since 5/06 hospital visit Pt reqs new Flexeril rx

## 2012-03-06 ENCOUNTER — Telehealth: Payer: Self-pay | Admitting: Obstetrics and Gynecology

## 2012-03-06 ENCOUNTER — Ambulatory Visit (INDEPENDENT_AMBULATORY_CARE_PROVIDER_SITE_OTHER): Payer: BC Managed Care – PPO

## 2012-03-06 ENCOUNTER — Ambulatory Visit (INDEPENDENT_AMBULATORY_CARE_PROVIDER_SITE_OTHER): Payer: BC Managed Care – PPO | Admitting: Obstetrics and Gynecology

## 2012-03-06 VITALS — BP 110/62 | Temp 98.1°F | Wt 226.0 lb

## 2012-03-06 DIAGNOSIS — Z3490 Encounter for supervision of normal pregnancy, unspecified, unspecified trimester: Secondary | ICD-10-CM

## 2012-03-06 DIAGNOSIS — O26849 Uterine size-date discrepancy, unspecified trimester: Secondary | ICD-10-CM

## 2012-03-06 LAB — US OB FOLLOW UP

## 2012-03-06 NOTE — Telephone Encounter (Signed)
Pt called c/o spotting after using BR, w BRB initially sm amt and now is spotting. Was seen today per Dr. Su Hilt, had GBS, pelvic and [place]. WNL pt states cervix was 1/80. Pt is scheduled for primary C/S secondary to hx of myomectomy. States she was seen in for blding last week with no abnormal findings. She denies any ctx, has some mild cramping that is not new. +FM. Enc pt to lay day down with feet up, PO hydrate, FKC and CTO. Call if blding continues

## 2012-03-06 NOTE — Progress Notes (Signed)
No new complaints and no further spotting U/S 8lbs 2oz > 90th%, AFI 16cm, nl fluid, vtx, post placenta GBS today with consent and sensitivities 2 allergy to keflex Pt is sched for c/s on 03/25/12 FKCs and Labor Precautions RTO 1 wk Diet discussed

## 2012-03-10 ENCOUNTER — Encounter (HOSPITAL_COMMUNITY): Payer: Self-pay | Admitting: Pharmacist

## 2012-03-12 ENCOUNTER — Ambulatory Visit (INDEPENDENT_AMBULATORY_CARE_PROVIDER_SITE_OTHER): Payer: BC Managed Care – PPO | Admitting: Obstetrics and Gynecology

## 2012-03-12 VITALS — BP 114/84 | Ht 67.0 in | Wt 226.0 lb

## 2012-03-12 DIAGNOSIS — IMO0002 Reserved for concepts with insufficient information to code with codable children: Secondary | ICD-10-CM

## 2012-03-12 DIAGNOSIS — Z22330 Carrier of Group B streptococcus: Secondary | ICD-10-CM

## 2012-03-12 DIAGNOSIS — O09899 Supervision of other high risk pregnancies, unspecified trimester: Secondary | ICD-10-CM

## 2012-03-12 DIAGNOSIS — O09529 Supervision of elderly multigravida, unspecified trimester: Secondary | ICD-10-CM

## 2012-03-12 DIAGNOSIS — O9989 Other specified diseases and conditions complicating pregnancy, childbirth and the puerperium: Secondary | ICD-10-CM

## 2012-03-12 DIAGNOSIS — O3429 Maternal care due to uterine scar from other previous surgery: Secondary | ICD-10-CM

## 2012-03-12 DIAGNOSIS — O99820 Streptococcus B carrier state complicating pregnancy: Secondary | ICD-10-CM

## 2012-03-12 NOTE — Progress Notes (Signed)
Desires cx check today. 

## 2012-03-12 NOTE — Progress Notes (Signed)
GBS positive sensitivities pending

## 2012-03-13 LAB — CULTURE, BETA STREP (GROUP B ONLY)

## 2012-03-14 ENCOUNTER — Encounter (HOSPITAL_COMMUNITY): Payer: Self-pay | Admitting: *Deleted

## 2012-03-14 ENCOUNTER — Inpatient Hospital Stay (HOSPITAL_COMMUNITY): Payer: BC Managed Care – PPO | Admitting: Anesthesiology

## 2012-03-14 ENCOUNTER — Encounter (HOSPITAL_COMMUNITY)
Admission: AD | Disposition: A | Discharge status: Home / Self Care | Payer: Self-pay | Source: Ambulatory Visit | Attending: Obstetrics and Gynecology

## 2012-03-14 ENCOUNTER — Inpatient Hospital Stay (HOSPITAL_COMMUNITY)
Admission: AD | Admit: 2012-03-14 | Discharge: 2012-03-17 | DRG: 370 | Disposition: A | Discharge status: Home / Self Care | Payer: BC Managed Care – PPO | Source: Ambulatory Visit | Attending: Obstetrics and Gynecology | Admitting: Obstetrics and Gynecology

## 2012-03-14 ENCOUNTER — Encounter (HOSPITAL_COMMUNITY): Payer: Self-pay | Admitting: Anesthesiology

## 2012-03-14 DIAGNOSIS — O3490 Maternal care for abnormality of pelvic organ, unspecified, unspecified trimester: Principal | ICD-10-CM | POA: Diagnosis present

## 2012-03-14 DIAGNOSIS — O9903 Anemia complicating the puerperium: Secondary | ICD-10-CM | POA: Diagnosis not present

## 2012-03-14 DIAGNOSIS — O99892 Other specified diseases and conditions complicating childbirth: Secondary | ICD-10-CM | POA: Diagnosis present

## 2012-03-14 DIAGNOSIS — D649 Anemia, unspecified: Secondary | ICD-10-CM | POA: Diagnosis not present

## 2012-03-14 DIAGNOSIS — N979 Female infertility, unspecified: Secondary | ICD-10-CM

## 2012-03-14 DIAGNOSIS — O479 False labor, unspecified: Secondary | ICD-10-CM

## 2012-03-14 DIAGNOSIS — O09529 Supervision of elderly multigravida, unspecified trimester: Secondary | ICD-10-CM

## 2012-03-14 DIAGNOSIS — O3429 Maternal care due to uterine scar from other previous surgery: Secondary | ICD-10-CM

## 2012-03-14 DIAGNOSIS — O4693 Antepartum hemorrhage, unspecified, third trimester: Secondary | ICD-10-CM

## 2012-03-14 DIAGNOSIS — Z22330 Carrier of Group B streptococcus: Secondary | ICD-10-CM

## 2012-03-14 DIAGNOSIS — O09519 Supervision of elderly primigravida, unspecified trimester: Secondary | ICD-10-CM | POA: Diagnosis present

## 2012-03-14 DIAGNOSIS — IMO0002 Reserved for concepts with insufficient information to code with codable children: Secondary | ICD-10-CM

## 2012-03-14 LAB — CBC
Hemoglobin: 11.1 g/dL — ABNORMAL LOW (ref 12.0–15.0)
MCH: 29.8 pg (ref 26.0–34.0)
MCHC: 32.6 g/dL (ref 30.0–36.0)
MCV: 91.4 fL (ref 78.0–100.0)
Platelets: 248 10*3/uL (ref 150–400)

## 2012-03-14 SURGERY — Surgical Case
Anesthesia: Spinal

## 2012-03-14 MED ORDER — PROMETHAZINE HCL 25 MG/ML IJ SOLN: 6.2500 mg | INTRAMUSCULAR | Status: DC | PRN

## 2012-03-14 MED ORDER — SCOPOLAMINE 1 MG/3DAYS TD PT72
1.0000 | MEDICATED_PATCH | Freq: Once | TRANSDERMAL | Status: DC
  Administered 2012-03-14: 1.5 mg via TRANSDERMAL

## 2012-03-14 MED ORDER — FENTANYL CITRATE 0.05 MG/ML IJ SOLN
INTRAMUSCULAR | Status: DC | PRN
  Administered 2012-03-14: 25 ug via INTRATHECAL

## 2012-03-14 MED ORDER — TERBUTALINE SULFATE 1 MG/ML IJ SOLN
0.2500 mg | Freq: Once | INTRAMUSCULAR | Status: AC
  Administered 2012-03-14: 0.25 mg via SUBCUTANEOUS

## 2012-03-14 MED ORDER — FAMOTIDINE IN NACL 20-0.9 MG/50ML-% IV SOLN
20.0000 mg | Freq: Once | INTRAVENOUS | Status: AC
  Administered 2012-03-14: 20 mg via INTRAVENOUS

## 2012-03-14 MED ORDER — OXYTOCIN 10 UNIT/ML IJ SOLN
INTRAMUSCULAR | Status: AC
  Filled 2012-03-14: qty 2

## 2012-03-14 MED ORDER — KETOROLAC TROMETHAMINE 30 MG/ML IJ SOLN
30.0000 mg | Freq: Four times a day (QID) | INTRAMUSCULAR | Status: AC | PRN
  Administered 2012-03-14: 30 mg via INTRAVENOUS

## 2012-03-14 MED ORDER — MEPERIDINE HCL 25 MG/ML IJ SOLN: 6.2500 mg | INTRAMUSCULAR | Status: DC | PRN

## 2012-03-14 MED ORDER — CITRIC ACID-SODIUM CITRATE 334-500 MG/5ML PO SOLN
30.0000 mL | Freq: Once | ORAL | Status: AC
  Administered 2012-03-14: 30 mL via ORAL

## 2012-03-14 MED ORDER — CITRIC ACID-SODIUM CITRATE 334-500 MG/5ML PO SOLN
ORAL | Status: AC
  Administered 2012-03-14: 30 mL via ORAL
  Filled 2012-03-14: qty 15

## 2012-03-14 MED ORDER — FAMOTIDINE IN NACL 20-0.9 MG/50ML-% IV SOLN
INTRAVENOUS | Status: AC
  Administered 2012-03-14: 20 mg via INTRAVENOUS
  Filled 2012-03-14: qty 50

## 2012-03-14 MED ORDER — OXYTOCIN 20 UNITS IN LACTATED RINGERS INFUSION - SIMPLE
INTRAVENOUS | Status: DC | PRN
  Administered 2012-03-14: 20 [IU] via INTRAVENOUS

## 2012-03-14 MED ORDER — SCOPOLAMINE 1 MG/3DAYS TD PT72
MEDICATED_PATCH | TRANSDERMAL | Status: AC
  Administered 2012-03-14: 1.5 mg via TRANSDERMAL
  Filled 2012-03-14: qty 1

## 2012-03-14 MED ORDER — KETOROLAC TROMETHAMINE 30 MG/ML IJ SOLN
INTRAMUSCULAR | Status: AC
  Administered 2012-03-14: 30 mg via INTRAVENOUS
  Filled 2012-03-14: qty 1

## 2012-03-14 MED ORDER — TERBUTALINE SULFATE 1 MG/ML IJ SOLN
INTRAMUSCULAR | Status: AC
  Administered 2012-03-14: 0.25 mg via SUBCUTANEOUS
  Filled 2012-03-14: qty 1

## 2012-03-14 MED ORDER — FENTANYL CITRATE 0.05 MG/ML IJ SOLN
INTRAMUSCULAR | Status: AC
  Filled 2012-03-14: qty 2

## 2012-03-14 MED ORDER — ONDANSETRON HCL 4 MG/2ML IJ SOLN
INTRAMUSCULAR | Status: DC | PRN
  Administered 2012-03-14: 4 mg via INTRAVENOUS

## 2012-03-14 MED ORDER — ACETAMINOPHEN 325 MG PO TABS: 325.0000 mg | ORAL_TABLET | ORAL | Status: DC | PRN

## 2012-03-14 MED ORDER — ONDANSETRON HCL 4 MG/2ML IJ SOLN
INTRAMUSCULAR | Status: AC
  Filled 2012-03-14: qty 2

## 2012-03-14 MED ORDER — MORPHINE SULFATE 0.5 MG/ML IJ SOLN
INTRAMUSCULAR | Status: AC
  Filled 2012-03-14: qty 10

## 2012-03-14 MED ORDER — BUTORPHANOL TARTRATE 2 MG/ML IJ SOLN
1.0000 mg | INTRAMUSCULAR | Status: DC | PRN
  Administered 2012-03-14 (×2): 1 mg via INTRAVENOUS
  Filled 2012-03-14: qty 1

## 2012-03-14 MED ORDER — MORPHINE SULFATE (PF) 0.5 MG/ML IJ SOLN
INTRAMUSCULAR | Status: DC | PRN
  Administered 2012-03-14: .15 mg via EPIDURAL

## 2012-03-14 MED ORDER — LACTATED RINGERS IV SOLN
INTRAVENOUS | Status: DC | PRN
  Administered 2012-03-14 (×3): via INTRAVENOUS

## 2012-03-14 MED ORDER — MIDAZOLAM HCL 2 MG/2ML IJ SOLN: 0.5000 mg | Freq: Once | INTRAMUSCULAR | Status: DC | PRN

## 2012-03-14 MED ORDER — FENTANYL CITRATE 0.05 MG/ML IJ SOLN: 25.0000 ug | INTRAMUSCULAR | Status: DC | PRN

## 2012-03-14 MED ORDER — KETOROLAC TROMETHAMINE 30 MG/ML IJ SOLN: 30.0000 mg | Freq: Four times a day (QID) | INTRAMUSCULAR | Status: AC | PRN

## 2012-03-14 MED ORDER — GENTAMICIN SULFATE 40 MG/ML IJ SOLN
INTRAVENOUS | Status: AC
  Administered 2012-03-14: 100 mL via INTRAVENOUS
  Filled 2012-03-14: qty 2.5

## 2012-03-14 SURGICAL SUPPLY — 32 items
BENZOIN TINCTURE PRP APPL 2/3 (GAUZE/BANDAGES/DRESSINGS) ×2 IMPLANT
CHLORAPREP W/TINT 26ML (MISCELLANEOUS) ×2 IMPLANT
CLOTH BEACON ORANGE TIMEOUT ST (SAFETY) ×2 IMPLANT
CONTAINER PREFILL 10% NBF 15ML (MISCELLANEOUS) IMPLANT
DRAIN JACKSON PRT FLT 10 (DRAIN) IMPLANT
ELECT REM PT RETURN 9FT ADLT (ELECTROSURGICAL) ×2
ELECTRODE REM PT RTRN 9FT ADLT (ELECTROSURGICAL) ×1 IMPLANT
EVACUATOR SILICONE 100CC (DRAIN) IMPLANT
EXTRACTOR VACUUM M CUP 4 TUBE (SUCTIONS) IMPLANT
GLOVE BIO SURGEON STRL SZ 6.5 (GLOVE) ×2 IMPLANT
GLOVE BIOGEL PI IND STRL 7.0 (GLOVE) ×1 IMPLANT
GLOVE BIOGEL PI INDICATOR 7.0 (GLOVE) ×1
GOWN PREVENTION PLUS LG XLONG (DISPOSABLE) ×6 IMPLANT
KIT ABG SYR 3ML LUER SLIP (SYRINGE) IMPLANT
NEEDLE HYPO 25X5/8 SAFETYGLIDE (NEEDLE) IMPLANT
NS IRRIG 1000ML POUR BTL (IV SOLUTION) ×2 IMPLANT
PACK C SECTION WH (CUSTOM PROCEDURE TRAY) ×2 IMPLANT
RTRCTR C-SECT PINK 25CM LRG (MISCELLANEOUS) IMPLANT
SLEEVE SCD COMPRESS KNEE MED (MISCELLANEOUS) IMPLANT
STAPLER VISISTAT 35W (STAPLE) IMPLANT
STRIP CLOSURE SKIN 1/2X4 (GAUZE/BANDAGES/DRESSINGS) ×2 IMPLANT
SUT CHROMIC 0 CT 1 (SUTURE) ×2 IMPLANT
SUT MNCRL AB 3-0 PS2 27 (SUTURE) IMPLANT
SUT PLAIN 2 0 (SUTURE) ×2
SUT PLAIN 2 0 XLH (SUTURE) ×2 IMPLANT
SUT PLAIN ABS 2-0 CT1 27XMFL (SUTURE) ×2 IMPLANT
SUT SILK 2 0 SH (SUTURE) IMPLANT
SUT VIC AB 0 CTX 36 (SUTURE) ×4
SUT VIC AB 0 CTX36XBRD ANBCTRL (SUTURE) ×4 IMPLANT
TOWEL OR 17X24 6PK STRL BLUE (TOWEL DISPOSABLE) ×4 IMPLANT
TRAY FOLEY CATH 14FR (SET/KITS/TRAYS/PACK) ×2 IMPLANT
WATER STERILE IRR 1000ML POUR (IV SOLUTION) ×2 IMPLANT

## 2012-03-14 NOTE — Anesthesia Preprocedure Evaluation (Addendum)
Anesthesia Evaluation  Patient identified by MRN, date of birth, ID band Patient awake    Reviewed: Allergy & Precautions, H&P , NPO status , Patient's Chart, lab work & pertinent test results  History of Anesthesia Complications (+) PROLONGED EMERGENCE  Airway Mallampati: III      Dental No notable dental hx.    Pulmonary neg pulmonary ROS,  breath sounds clear to auscultation  Pulmonary exam normal       Cardiovascular Exercise Tolerance: Good negative cardio ROS  + dysrhythmias Rhythm:regular Rate:Normal     Neuro/Psych negative neurological ROS  negative psych ROS   GI/Hepatic negative GI ROS, Neg liver ROS, GERD-  ,  Endo/Other  negative endocrine ROSMorbid obesity  Renal/GU negative Renal ROS  negative genitourinary   Musculoskeletal   Abdominal Normal abdominal exam  (+)   Peds  Hematology negative hematology ROS (+)   Anesthesia Other Findings Difficulty waking 2011 difficulty waking up GERD (gastroesophageal reflux disease)        Complication of anesthesia   trouble waking up at last surgery Fibroid 2001      H/O varicella    Reproductive/Obstetrics (+) Pregnancy                          Anesthesia Physical Anesthesia Plan  ASA: III and Emergent  Anesthesia Plan: Spinal   Post-op Pain Management:    Induction:   Airway Management Planned:   Additional Equipment:   Intra-op Plan:   Post-operative Plan:   Informed Consent: I have reviewed the patients History and Physical, chart, labs and discussed the procedure including the risks, benefits and alternatives for the proposed anesthesia with the patient or authorized representative who has indicated his/her understanding and acceptance.     Plan Discussed with: Anesthesiologist, CRNA and Surgeon  Anesthesia Plan Comments:        Anesthesia Quick Evaluation

## 2012-03-14 NOTE — H&P (Signed)
Heather Odonnell is a 36 y.o. female presenting with SROM at 4pm, clear fluid, with bloody show, and UCs q 2-3 minutes.  She is scheduled for a primary C/S due to previous myomectomy.  Pregnancy remarkable for: Previous myomectomy--C/S planned. GBS positive. LGA baby--last EFW 8+ Hx infertility AMA  History of present pregnancy: Patient entered care at 10 weeks.  EDC of 03/30/12 was established by IVF date.  Anatomy scan was done at 18 weeks, with normal findings and an low lying placenta, that subsequently resolved.  She had an episode of questionable low heart rate, and was admitted overnight--fetal baseline was 100-120, with good variability. Further signficant events were an episode of bleeding in early May, now resolved.  GBS was positive, with sensitivity to clindamycin--patient allergic to Cephalexin. At her last evalution, she was 1 cm.  OB History    Grav Para Term Preterm Abortions TAB SAB Ect Mult Living   1              Past Medical History  Diagnosis Date  . Difficulty waking 2011    difficulty waking up  . GERD (gastroesophageal reflux disease)   . Complication of anesthesia     trouble waking up at last surgery  . Fibroid 2001  . H/O varicella    Past Surgical History  Procedure Date  . Myomectomy abdominal approach 2001  . Tonsillectomy   . Neck surgery 2011  . Back surgery 2002   Family History: family history includes Cancer in her paternal grandmother; Diabetes in her sister; and Stroke in her paternal grandfather. Social History:  reports that she has quit smoking. She has never used smokeless tobacco. She reports that she does not drink alcohol or use illicit drugs.  ROS:  Leaking clear fluid, UCs, positive FM.  Dilation: 1 Exam by:: Heather Odonnell, CNM 100%, vtx, -1, leaking clear fluid with small amount bloody show.  Temperature 97.1 F (36.2 C), temperature source Oral, height 5\' 7"  (1.702 m), weight 226 lb (102.513 kg), last menstrual period  06/24/2011. Chest clear Heart RRR without Abd gravid, NT between UCs Ext WNL  Prenatal labs: ABO, Rh: AB/Positive/-- (08/15 0000) Antibody: Negative (08/15 0000) Rubella:  Immune RPR: Nonreactive (08/15 0000)  HBsAg: Negative (08/15 0000)  HIV: Non-reactive (08/15 0000)  GBS: Negative (04/18 0000)  Quad screen WNL.  Assessment/Plan: IUP at 37 5/7 weeks SROM, early labor Planned C/S due to myomectomy GBS positive Allergy to Cephalexin  Plan: Admitted to [facility] per consult with Dr. Normand Sloop Routine pre-op orders May give Stadol for comfort after consented, if OR start time delayed.. R&B C/S reviewed with patient, including bleeding, infection, damage to other organs--patient and husband seem to understand these risks and wish to proceed.  Heather Odonnell 03/14/2012, 5:57 PM

## 2012-03-14 NOTE — Transfer of Care (Signed)
Immediate Anesthesia Transfer of Care Note  Patient: Heather Odonnell  Procedure(s) Performed: Procedure(s) (LRB): CESAREAN SECTION (N/A)  Patient Location: PACU  Anesthesia Type: Spinal  Level of Consciousness: awake  Airway & Oxygen Therapy: Patient Spontanous Breathing  Post-op Assessment: Report given to PACU RN  Post vital signs: Reviewed and stable  Complications: No apparent anesthesia complications

## 2012-03-14 NOTE — Anesthesia Postprocedure Evaluation (Signed)
  Anesthesia Post-op Note  Patient: Heather Odonnell  Procedure(s) Performed: Procedure(s) (LRB): CESAREAN SECTION (N/A)  Patient is awake, responsive, moving her legs, and has signs of resolution of her numbness. Pain and nausea are reasonably well controlled. Vital signs are stable and clinically acceptable. Oxygen saturation is clinically acceptable. There are no apparent anesthetic complications at this time. Patient is ready for discharge.

## 2012-03-14 NOTE — MAU Note (Signed)
Pt in for labor eval, is scheduled for c/s due to hx of myomectomy.  Reports srom at 1600.  Denies any bleeding.  + FM.

## 2012-03-14 NOTE — Op Note (Signed)
Cesarean Section Procedure Note   Heather Odonnell  03/14/2012  Indications: Scheduled Proceedure/Maternal Request and due to previous myomectomy and SROM with Labor   Pre-operative Diagnosis: previous myomectomy.   Post-operative Diagnosis: Same   Surgeon: Surgeon(s) and Role:    * Michael Litter, MD - Primary   Assistants: Manfred Arch CNM  Anesthesia: spinal   Procedure Details:  The patient was seen in the Holding Room. The risks, benefits, complications, treatment options, and expected outcomes were discussed with the patient. The patient concurred with the proposed plan, giving informed consent. identified as TALINE NASS and the procedure verified as C-Section Delivery. A Time Out was held and the above information confirmed.  After induction of anesthesia, the patient was draped and prepped in the usual sterile manner. A transverse incision was made and carried down through the subcutaneous tissue to the fascia. Fascial incision was made in the midline and extended transversely. The fascia was separated from the underlying rectus muscle superiorly and inferiorly. The peritoneum was identified and entered. Peritoneal incision was extended longitudinally with good visualization of bowel and bladder. The alexsis retractor was placed in the abdomen.The utero-vesical peritoneal reflection was incised transversely and the bladder flap was bluntly freed from the lower uterine segment. A low transverse uterine incision was made. Delivered from cephalic presentation was a  pound infant, with Apgar scores of 8 at one minute and 9 at five minutes. Cord ph was not sent the umbilical cord was clamped and cut cord blood was obtained for evaluation. The placenta was removed Intact and appeared normal. The uterine outline, tubes and ovaries appeared normal}. The uterine incision was closed with running locked sutures of 0Vicryl. A second layer 0 vicrlyl was used to imbricate the uterine incision     Hemostasis was observed. Lavage was carried out until clear. The alexsis was removed.   The fascia was then reapproximated with running sutures of 0vicrly.  The subcutaneous tissue was reapproximated using interrupted suture of 0 plain gut. The subcuticular closure was performed using 3-55monocryl     Instrument, sponge, and needle counts were correct prior the abdominal closure and were correct at the conclusion of the case.    Findings: nmale infant in vtx presentation.  With apgars of 8,9.  Clear fluid noted   Estimated Blood Loss:  Total IV Fluids:   Urine Output: 400CC OF clear urine  Specimens: placenta to pathology  Complications: no complications  Disposition: PACU - hemodynamically stable.   Maternal Condition: stable   Baby condition / location:  nursery-stable  Attending Attestation: I was present and scrubbed for the entire procedure.   Signed: Surgeon(s): Michael Litter, MD

## 2012-03-14 NOTE — Anesthesia Procedure Notes (Signed)

## 2012-03-15 LAB — CBC
HCT: 29.4 % — ABNORMAL LOW (ref 36.0–46.0)
MCV: 91.6 fL (ref 78.0–100.0)
RBC: 3.21 MIL/uL — ABNORMAL LOW (ref 3.87–5.11)
WBC: 15.6 10*3/uL — ABNORMAL HIGH (ref 4.0–10.5)

## 2012-03-15 LAB — RPR: RPR Ser Ql: NONREACTIVE

## 2012-03-15 MED ORDER — DIPHENHYDRAMINE HCL 25 MG PO CAPS: 25.0000 mg | ORAL_CAPSULE | Freq: Four times a day (QID) | ORAL | Status: DC | PRN

## 2012-03-15 MED ORDER — FERROUS SULFATE 325 (65 FE) MG PO TABS: 325.0000 mg | ORAL_TABLET | Freq: Two times a day (BID) | ORAL | Status: DC

## 2012-03-15 MED ORDER — IBUPROFEN 600 MG PO TABS
600.0000 mg | ORAL_TABLET | Freq: Four times a day (QID) | ORAL | Status: DC
  Administered 2012-03-15 – 2012-03-17 (×10): 600 mg via ORAL
  Filled 2012-03-15 (×10): qty 1

## 2012-03-15 MED ORDER — OXYTOCIN 20 UNITS IN LACTATED RINGERS INFUSION - SIMPLE: 125.0000 mL/h | INTRAVENOUS | Status: DC

## 2012-03-15 MED ORDER — ZOLPIDEM TARTRATE 5 MG PO TABS: 5.0000 mg | ORAL_TABLET | Freq: Every evening | ORAL | Status: DC | PRN

## 2012-03-15 MED ORDER — HYDROCODONE-ACETAMINOPHEN 5-325 MG PO TABS
1.0000 | ORAL_TABLET | ORAL | Status: DC | PRN
  Administered 2012-03-15: 1 via ORAL
  Administered 2012-03-16: 2 via ORAL
  Administered 2012-03-16 – 2012-03-17 (×4): 1 via ORAL
  Administered 2012-03-17 (×2): 2 via ORAL
  Filled 2012-03-15: qty 2
  Filled 2012-03-15 (×2): qty 1
  Filled 2012-03-15: qty 2
  Filled 2012-03-15 (×2): qty 1
  Filled 2012-03-15: qty 2
  Filled 2012-03-15: qty 1

## 2012-03-15 MED ORDER — DIBUCAINE 1 % RE OINT: 1.0000 "application " | TOPICAL_OINTMENT | RECTAL | Status: DC | PRN

## 2012-03-15 MED ORDER — NALBUPHINE HCL 10 MG/ML IJ SOLN
5.0000 mg | INTRAMUSCULAR | Status: DC | PRN
  Filled 2012-03-15: qty 1

## 2012-03-15 MED ORDER — MENTHOL 3 MG MT LOZG: 1.0000 | LOZENGE | OROMUCOSAL | Status: DC | PRN

## 2012-03-15 MED ORDER — ACETAMINOPHEN 10 MG/ML IV SOLN
1000.0000 mg | Freq: Four times a day (QID) | INTRAVENOUS | Status: AC | PRN
  Filled 2012-03-15: qty 100

## 2012-03-15 MED ORDER — DIPHENHYDRAMINE HCL 25 MG PO CAPS: 25.0000 mg | ORAL_CAPSULE | ORAL | Status: DC | PRN

## 2012-03-15 MED ORDER — WITCH HAZEL-GLYCERIN EX PADS: 1.0000 "application " | MEDICATED_PAD | CUTANEOUS | Status: DC | PRN

## 2012-03-15 MED ORDER — LORATADINE 10 MG PO TABS
10.0000 mg | ORAL_TABLET | Freq: Every day | ORAL | Status: DC
  Administered 2012-03-15 – 2012-03-16 (×2): 10 mg via ORAL
  Filled 2012-03-15 (×3): qty 1

## 2012-03-15 MED ORDER — PSEUDOEPHEDRINE HCL ER 120 MG PO TB12
120.0000 mg | ORAL_TABLET | Freq: Two times a day (BID) | ORAL | Status: DC | PRN
  Administered 2012-03-15 – 2012-03-16 (×2): 120 mg via ORAL
  Filled 2012-03-15 (×2): qty 1

## 2012-03-15 MED ORDER — SODIUM CHLORIDE 0.9 % IJ SOLN: 3.0000 mL | INTRAMUSCULAR | Status: DC | PRN

## 2012-03-15 MED ORDER — ONDANSETRON HCL 4 MG PO TABS: 4.0000 mg | ORAL_TABLET | ORAL | Status: DC | PRN

## 2012-03-15 MED ORDER — LACTATED RINGERS IV SOLN: INTRAVENOUS | Status: DC

## 2012-03-15 MED ORDER — MEASLES, MUMPS & RUBELLA VAC ~~LOC~~ INJ
0.5000 mL | INJECTION | Freq: Once | SUBCUTANEOUS | Status: DC
  Filled 2012-03-15: qty 0.5

## 2012-03-15 MED ORDER — SIMETHICONE 80 MG PO CHEW: 80.0000 mg | CHEWABLE_TABLET | ORAL | Status: DC | PRN

## 2012-03-15 MED ORDER — BISACODYL 10 MG RE SUPP: 10.0000 mg | Freq: Every day | RECTAL | Status: DC | PRN

## 2012-03-15 MED ORDER — FERROUS SULFATE 325 (65 FE) MG PO TABS
325.0000 mg | ORAL_TABLET | Freq: Two times a day (BID) | ORAL | Status: DC
  Administered 2012-03-15 – 2012-03-17 (×5): 325 mg via ORAL
  Filled 2012-03-15 (×6): qty 1

## 2012-03-15 MED ORDER — METOCLOPRAMIDE HCL 5 MG/ML IJ SOLN: 10.0000 mg | Freq: Three times a day (TID) | INTRAMUSCULAR | Status: DC | PRN

## 2012-03-15 MED ORDER — LANOLIN HYDROUS EX OINT: 1.0000 "application " | TOPICAL_OINTMENT | CUTANEOUS | Status: DC | PRN

## 2012-03-15 MED ORDER — DIPHENHYDRAMINE HCL 50 MG/ML IJ SOLN: 25.0000 mg | INTRAMUSCULAR | Status: DC | PRN

## 2012-03-15 MED ORDER — NALOXONE HCL 0.4 MG/ML IJ SOLN
1.0000 ug/kg/h | INTRAMUSCULAR | Status: DC | PRN
  Filled 2012-03-15: qty 2.5

## 2012-03-15 MED ORDER — PRENATAL MULTIVITAMIN CH
1.0000 | ORAL_TABLET | Freq: Every day | ORAL | Status: DC
  Administered 2012-03-15 – 2012-03-17 (×3): 1 via ORAL
  Filled 2012-03-15 (×3): qty 1

## 2012-03-15 MED ORDER — ONDANSETRON HCL 4 MG/2ML IJ SOLN: 4.0000 mg | INTRAMUSCULAR | Status: DC | PRN

## 2012-03-15 MED ORDER — SENNOSIDES-DOCUSATE SODIUM 8.6-50 MG PO TABS
2.0000 | ORAL_TABLET | Freq: Every day | ORAL | Status: DC
  Administered 2012-03-15 – 2012-03-16 (×2): 2 via ORAL

## 2012-03-15 MED ORDER — FLEET ENEMA 7-19 GM/118ML RE ENEM: 1.0000 | ENEMA | Freq: Every day | RECTAL | Status: DC | PRN

## 2012-03-15 MED ORDER — ONDANSETRON HCL 4 MG/2ML IJ SOLN: 4.0000 mg | Freq: Three times a day (TID) | INTRAMUSCULAR | Status: DC | PRN

## 2012-03-15 MED ORDER — TETANUS-DIPHTH-ACELL PERTUSSIS 5-2.5-18.5 LF-MCG/0.5 IM SUSP
0.5000 mL | Freq: Once | INTRAMUSCULAR | Status: AC
  Administered 2012-03-15: 0.5 mL via INTRAMUSCULAR
  Filled 2012-03-15: qty 0.5

## 2012-03-15 MED ORDER — SIMETHICONE 80 MG PO CHEW
80.0000 mg | CHEWABLE_TABLET | Freq: Three times a day (TID) | ORAL | Status: DC
  Administered 2012-03-15 – 2012-03-17 (×8): 80 mg via ORAL

## 2012-03-15 MED ORDER — NALOXONE HCL 0.4 MG/ML IJ SOLN: 0.4000 mg | INTRAMUSCULAR | Status: DC | PRN

## 2012-03-15 MED ORDER — DIPHENHYDRAMINE HCL 50 MG/ML IJ SOLN: 12.5000 mg | INTRAMUSCULAR | Status: DC | PRN

## 2012-03-15 NOTE — Progress Notes (Signed)
Subjective: Postpartum Day 1: Cesarean Delivery Patient reports feeling well.  States she has ambulated x 3 without difficulty.  Denies weakness or dizziness.  Voiding without difficulty.  Passing flatus, no BM.  Working on breastfeeding.  Reports pain is well controlled with medications.      Objective: Vital signs in last 24 hours: Temp:  [97.1 F (36.2 C)-98.9 F (37.2 C)] 98 F (36.7 C) (05/25 1200) Pulse Rate:  [83-116] 95  (05/25 1200) Resp:  [16-21] 20  (05/25 1200) BP: (94-124)/(43-94) 110/67 mmHg (05/25 1200) SpO2:  [90 %-100 %] 98 % (05/25 1200) Weight:  [102.513 kg (226 lb)] 102.513 kg (226 lb) (05/24 1752)  Physical Exam:  General: alert, cooperative and no distress Heart:  RRR Lungs:  CTA bilat Abd:  Soft, non-tender with pos BS x 4 quad. Lochia: appropriate, sm rubra Uterine Fundus: firm, non-tender 1 below umb Incision: Dsg clean/dry/intact DVT Evaluation: No evidence of DVT seen on physical exam. Negative Homan's sign bilaterally 1+ edema noted bilaterally DTRs 1+, no clonus   Basename 03/15/12 0541 03/14/12 1909  HGB 9.6* 11.1*  HCT 29.4* 34.1*    Assessment/Plan: Stable s/p C/S. Asymptomatic anemia  Begin iron for anemia. Continue current plan of care.    Yarel Rushlow O. 03/15/2012, 3:38 PM

## 2012-03-15 NOTE — Progress Notes (Signed)
Patient pulled IV out, attempt X2 to restart, called [name]. Order received to discontinue IV fluids.

## 2012-03-16 NOTE — Progress Notes (Signed)
Subjective: Postpartum Day 2: Cesarean Delivery Patient reports tolerating PO, + flatus and no problems voiding.  BF'ng w/ RN on my arrival.  Circ planned for today.  Getting ready to eat breakfast.  Husband at bedside.  Up w/o dizziness.    Objective: Vital signs in last 24 hours: Temp:  [98 F (36.7 C)-98.4 F (36.9 C)] 98.4 F (36.9 C) (05/26 0520) Pulse Rate:  [98-100] 98  (05/26 0520) Resp:  [18] 18  (05/26 0520) BP: (107-121)/(73-83) 121/83 mmHg (05/26 0520) SpO2:  [97 %] 97 % (05/25 2025)  Physical Exam:  General: alert, cooperative and no distress Lochia: appropriate Uterine Fundus: firm, below umbilicus Incision: healing well, c/d/i DVT Evaluation: No evidence of DVT seen on physical exam. Negative Homan's sign. Calf/Ankle edema is present. 2-3+ BLE pitting edema   Basename 03/15/12 0541 03/14/12 1909  HGB 9.6* 11.1*  HCT 29.4* 34.1*    Assessment/Plan: Status post primary Cesarean section. Doing well postoperatively. Persisting BLE edema.   Continue current care.  Anticipate d/c tomorrow.  Lactating.  Kou Gucciardo H 03/16/2012, 1:02 PM

## 2012-03-17 ENCOUNTER — Encounter (HOSPITAL_COMMUNITY): Payer: Self-pay | Admitting: Obstetrics and Gynecology

## 2012-03-17 MED ORDER — HYDROCODONE-ACETAMINOPHEN 5-325 MG PO TABS: 1.0000 | ORAL_TABLET | ORAL | Status: AC | PRN

## 2012-03-17 MED ORDER — FERROUS SULFATE 325 (65 FE) MG PO TABS: 325.0000 mg | ORAL_TABLET | Freq: Two times a day (BID) | ORAL | Status: DC

## 2012-03-17 MED ORDER — IBUPROFEN 600 MG PO TABS: 600.0000 mg | ORAL_TABLET | Freq: Four times a day (QID) | ORAL | Status: AC

## 2012-03-17 NOTE — Discharge Instructions (Signed)
See Practice Brochure 

## 2012-03-17 NOTE — Progress Notes (Addendum)
Subjective: Postpartum Day 3: Cesarean Delivery Patient reports feeling well. Ambulating, voiding and tol po liquids and solids without difficulty.  Pos flatus, pos BM.  Breastfeeding without difficulty.  Teary this AM due to not sleeping and baby being fussy during the night.  Denies weakness or dizziness  Objective: Vital signs in last 24 hours: Temp:  [97.5 F (36.4 C)-98 F (36.7 C)] 98 F (36.7 C) (05/27 0439) Pulse Rate:  [87-96] 87  (05/27 0439) Resp:  [18] 18  (05/27 0439) BP: (113-116)/(76-80) 113/76 mmHg (05/27 0439)  Physical Exam:  General: alert, cooperative and no distress Heart:  RRR Lungs:  CTA bilat Breasts:  Mild engorgement Abd:  Soft, NT with pos BS x 4 quads Lochia: appropriate, sm rubra Uterine Fundus: firm, NT 2 below umb Incision: healing well without redness or drainage. DVT Evaluation: No evidence of DVT seen on physical exam. Neg Homan's bilat 2+ edema bilat in lower extrems. DTRs 2+, no clonus.    Basename 03/15/12 0541 03/14/12 1909  HGB 9.6* 11.1*  HCT 29.4* 34.1*    Assessment/Plan: Stable s/p C/S Asymptomatic anemia  Discharge to home.  Discharge instructions reviewed. Rx: Vicodin, Motrin, PNV, FeSo4 RTO 6wks for f/u. Undecided regarding contraception at present and RBA d/w pt.  Will discuss at 6wk PP visit.   Heather Odonnell O. 03/17/2012, 1:29 PM

## 2012-03-17 NOTE — Discharge Summary (Signed)
Obstetric Discharge Summary Reason for Admission:   SROM/Hx previous myomectomy Prenatal Procedures: ultrasound Intrapartum Procedures: cesarean: low cervical, transverse Postpartum Procedures: none Complications-Operative and Postpartum: none Hemoglobin  Date Value Range Status  03/15/2012 9.6* 12.0-15.0 (g/dL) Final     HCT  Date Value Range Status  03/15/2012 29.4* 36.0-46.0 (%) Final    Physical Exam:  General: alert, cooperative and mild distress Heart:  RRR Lungs: CTA bilat Abd:  Soft, NT with pos BS x 4 quads Lochia: appropriate, sm rubra Uterine Fundus: firm, NT 1 below umb Incision: healing well, steri strips intact DVT Evaluation: No evidence of DVT seen on physical exam. Negative Homan's sign bilat. No significant calf/ankle edema.  Discharge Diagnoses: Term Pregnancy-delivered  Discharge Information: Date: 03/17/2012 Activity: unrestricted Diet: routine Medications: PNV, Ibuprofen, Iron and Vicodin Condition: stable Instructions: refer to practice specific booklet Discharge to: home Follow-up Information    Follow up with [facility] in 6 weeks.       Contraception:  Undecided  Newborn Data: Live born female  Birth Weight: 8 lb 11.9 oz (3965 g) APGAR: 8, 9  Home with mother.  Darius Lundberg O. 03/17/2012, 5:04 PM

## 2012-03-18 ENCOUNTER — Encounter (HOSPITAL_COMMUNITY): Payer: Self-pay | Admitting: *Deleted

## 2012-03-21 ENCOUNTER — Other Ambulatory Visit (HOSPITAL_COMMUNITY): Payer: BC Managed Care – PPO

## 2012-03-25 ENCOUNTER — Inpatient Hospital Stay (HOSPITAL_COMMUNITY)
Admission: RE | Admit: 2012-03-25 | Payer: BC Managed Care – PPO | Source: Ambulatory Visit | Admitting: Obstetrics and Gynecology

## 2012-03-25 ENCOUNTER — Encounter (HOSPITAL_COMMUNITY): Admission: RE | Payer: Self-pay | Source: Ambulatory Visit

## 2012-03-25 SURGERY — Surgical Case
Anesthesia: Spinal

## 2012-03-27 ENCOUNTER — Telehealth: Payer: Self-pay | Admitting: Obstetrics and Gynecology

## 2012-03-27 NOTE — Telephone Encounter (Signed)
TC from pt.   S/P C/S 03/14/12.  Had plum size clot this AM but now having stringing clots with wiping.  Bleeding otherwise minimal  Explained may be due to pooling of blood.  To call if recurs more frequently or bleeding > pad/hr.   Also states is having a "twinge"  With urination. Unable to get to office today or 03/28/12.   Per VL  Advised eval at [facility].  When informed states would rather appt here.  Sched w/Dr AR 03/28/12.

## 2012-03-27 NOTE — Telephone Encounter (Signed)
Triage/epic 

## 2012-03-28 ENCOUNTER — Encounter: Payer: Self-pay | Admitting: Obstetrics and Gynecology

## 2012-03-28 ENCOUNTER — Ambulatory Visit (INDEPENDENT_AMBULATORY_CARE_PROVIDER_SITE_OTHER): Payer: BC Managed Care – PPO | Admitting: Obstetrics and Gynecology

## 2012-03-28 ENCOUNTER — Encounter: Payer: BC Managed Care – PPO | Admitting: Obstetrics and Gynecology

## 2012-03-28 VITALS — BP 130/78 | Temp 99.2°F | Ht 66.0 in | Wt 190.0 lb

## 2012-03-28 DIAGNOSIS — R300 Dysuria: Secondary | ICD-10-CM

## 2012-03-28 LAB — POCT URINALYSIS DIPSTICK
Glucose, UA: NEGATIVE
Ketones, UA: NEGATIVE
Spec Grav, UA: 1.01

## 2012-03-28 MED ORDER — VALACYCLOVIR HCL 500 MG PO TABS: 500.0000 mg | ORAL_TABLET | Freq: Two times a day (BID) | ORAL | Status: AC

## 2012-03-28 NOTE — Progress Notes (Signed)
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 Filed Vitals:   03/28/12 0906  BP: 130/78  Temp: 99.2 F (37.3 C)   ROS: noncontributory  Pelvic exam:  VULVA: normal appearing vulva with no masses, tenderness or lesions, VAGINA: normal appearing vagina with normal color and discharge, no lesions, CERVIX: normal appearing cervix without discharge or lesions,  UTERUS: uterus is normal size, shape, consistency and nontender,  ADNEXA: normal adnexa in size, nontender and no masses.  Light bleeding  A/P Reviewed HSV2 and answered several questions - fearful she passed or can pass to baby Pt wants Rx for poss acute episodes which she states are infrequent but had one last week - with resolution now Rx Valtrex sent to pharmacy CC Urine to Cx RTO for PP visit

## 2012-03-29 LAB — URINE CULTURE: Colony Count: 2000

## 2012-05-02 ENCOUNTER — Ambulatory Visit (INDEPENDENT_AMBULATORY_CARE_PROVIDER_SITE_OTHER): Payer: BC Managed Care – PPO | Admitting: Obstetrics and Gynecology

## 2012-05-02 ENCOUNTER — Encounter: Payer: Self-pay | Admitting: Obstetrics and Gynecology

## 2012-05-02 NOTE — Progress Notes (Signed)
Date of delivery: 03/14/2012 Female Name: Heather Odonnell Vaginal delivery:no Cesarean section:yes Tubal ligation:no GDM:no Breast Feeding:yes Bottle Feeding:yes Post-Partum Blues:no Abnormal pap:no Normal GU function: yes Normal GI function:yes Returning to work:no  EPDS = 6  No complaints Doing well s/p c-section Filed Vitals:   05/02/12 1422  BP: 120/62  Temp: 98.3 F (36.8 C)   ROS: noncontributory  Pelvic exam:  VULVA: normal appearing vulva with no masses, tenderness or lesions,  VAGINA: normal appearing vagina with normal color and discharge, no lesions, CERVIX: normal appearing cervix without discharge or lesions,  UTERUS: uterus is normal size, shape, consistency and nontender,  ADNEXA: normal adnexa in size, nontender and no masses.  A/P RTO for AEX at pt's convenience

## 2012-06-14 IMAGING — US US OB COMP +14 WK
1 series · 12 of 28 positions shown · non-contrast
Comparison: none

[Series 1: us ob comp +14 wk · 12 of 48 slices shown]
[im 2/48]
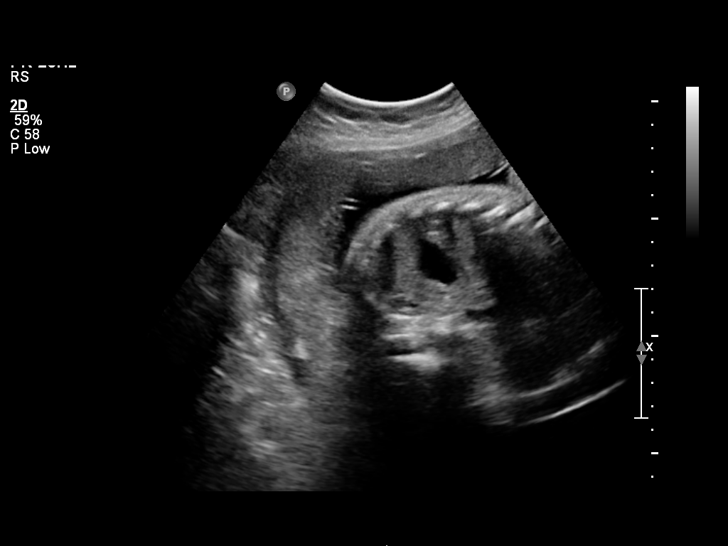
[im 6/48]
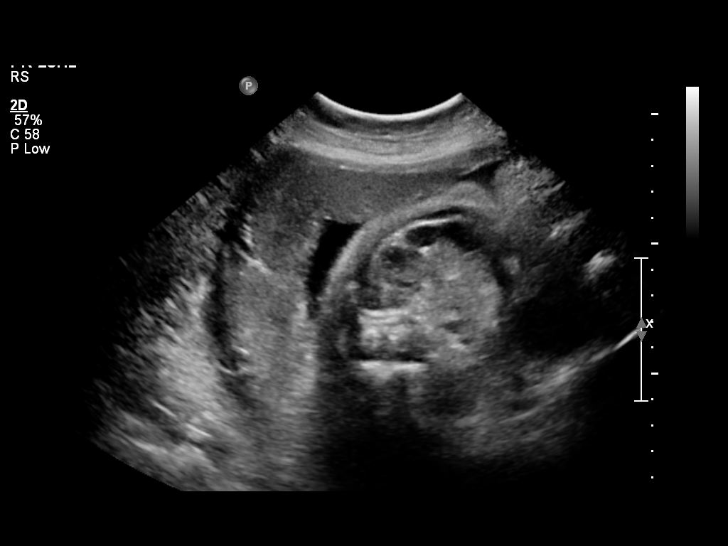
[im 9/48]
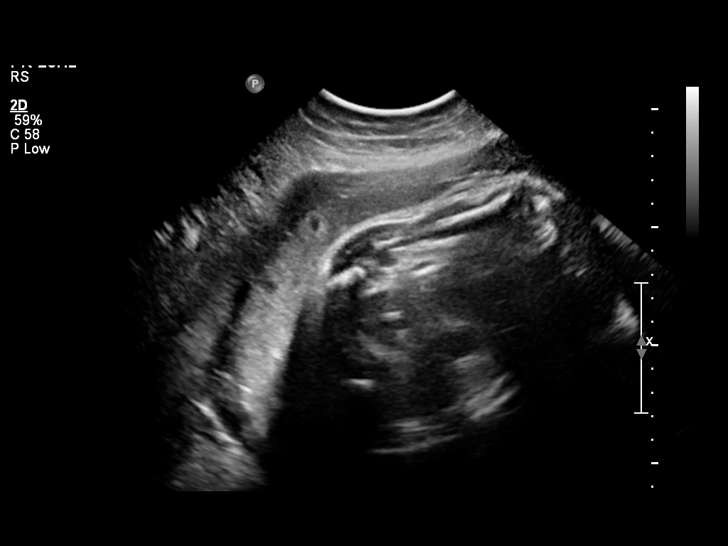
[im 14/48]
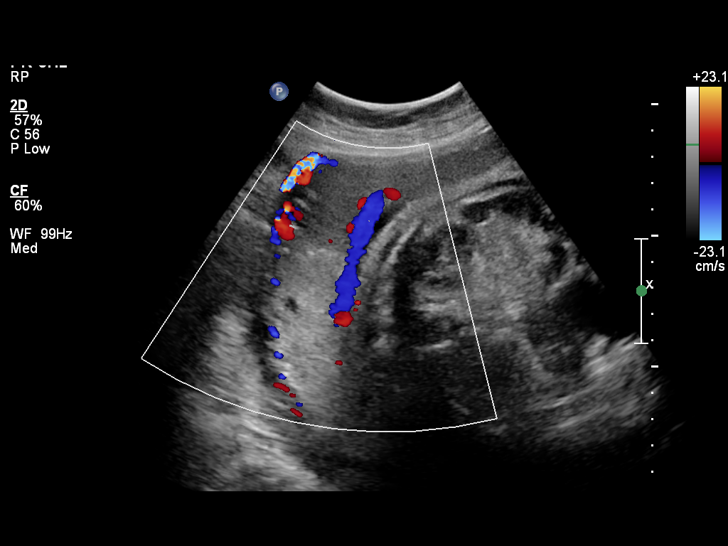
[im 18/48]
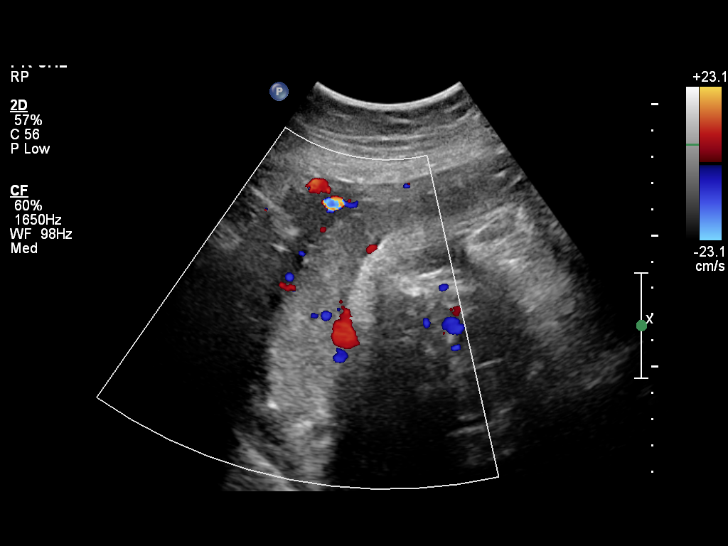
[im 21/48]
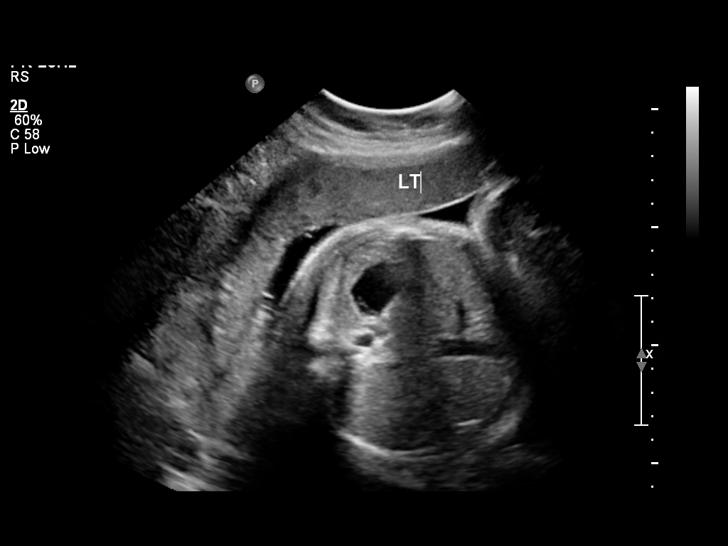
[im 27/48]
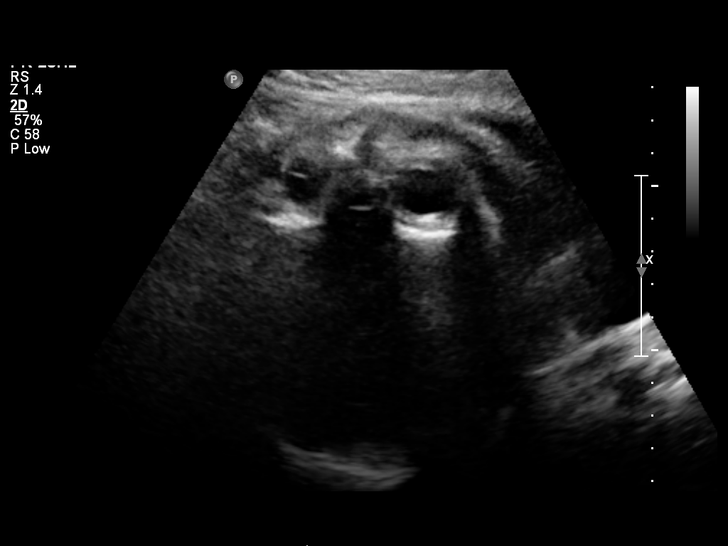
[im 30/48]
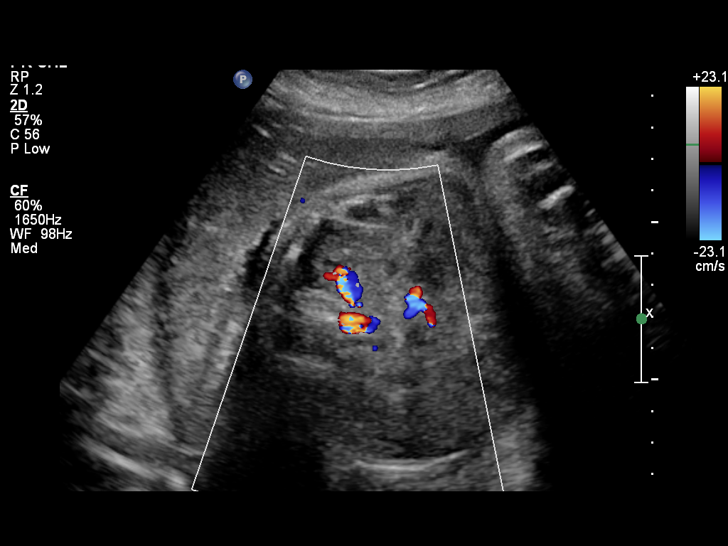
[im 34/48]
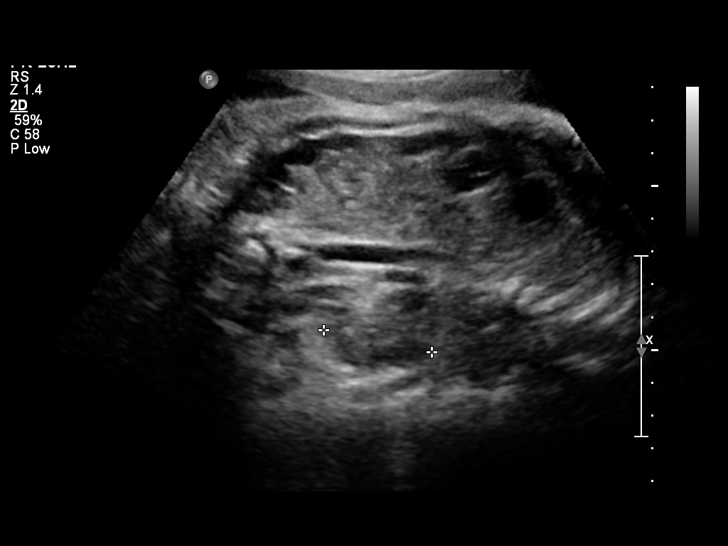
[im 39/48]
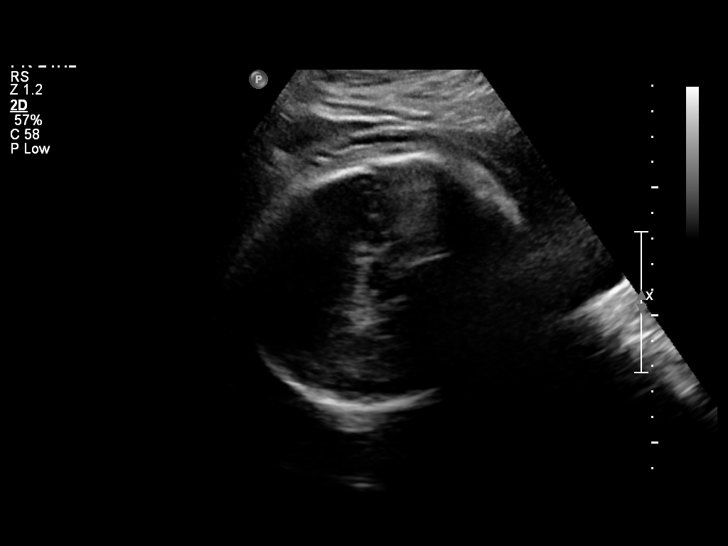
[im 42/48]
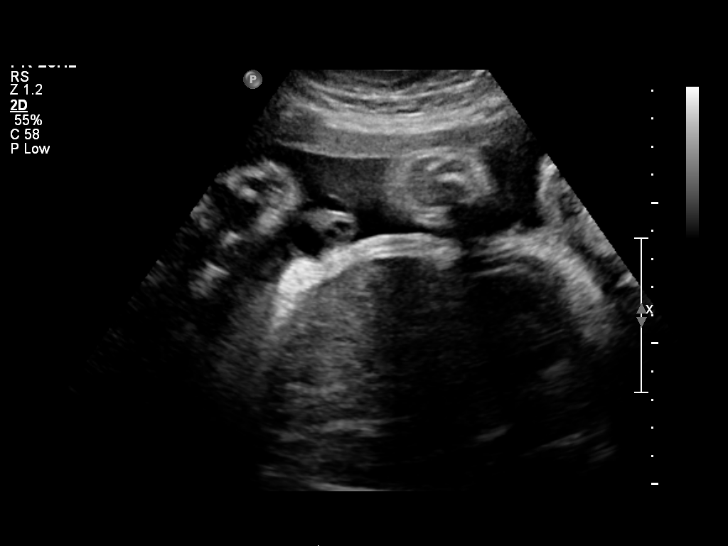
[im 46/48]
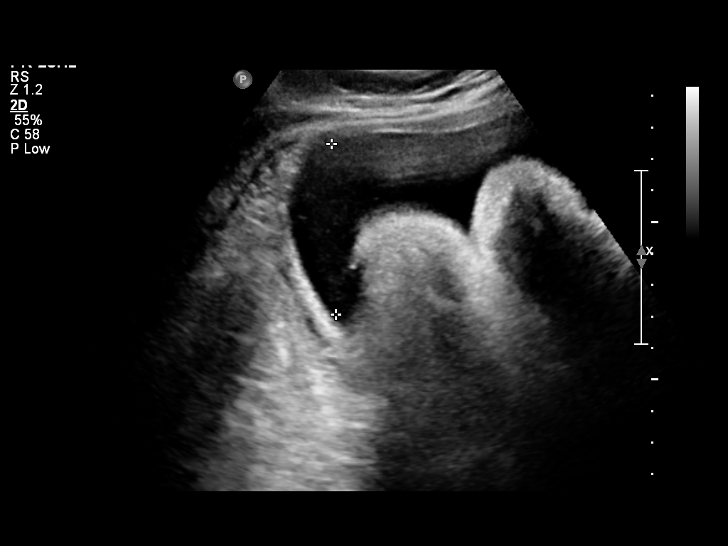

[12 of 28 positions shown; findings below may reference images not displayed]

OBSTETRICS REPORT
                      (Signed Final 02/25/2012 [DATE])

                 CNM
Procedures

 US OB COMP + 14 WK                                    76805.1
Indications

 IVF
 Advanced maternal age (AMA), Primigravida
 Size greater than dates (Large for gestational [AGE]
 Vaginal bleeding, unknown etiology
 Assess Fetal Growth / Estimated Fetal Weight
 Assess placenta
Fetal Evaluation

 Fetal Heart Rate:  128                         bpm
 Cardiac Activity:  Observed
 Presentation:      Cephalic
 Placenta:          Right lateral, above
                    cervical os

 Comment:    No placental abruption or previa identified.

 Amniotic Fluid
 AFI FV:      Subjectively within normal limits
 AFI Sum:     12.8    cm      41   %Tile     Larg Pckt:     5.4  cm
 RUQ:   3.84   cm    RLQ:    5.4    cm    LUQ:   1.69    cm   LLQ:    1.87   cm
Biometry

 BPD:       92  mm    G. Age:   37w 3d                CI:        76.32   70 - 86
                                                      FL/HC:      20.9   20.1 -

 HC:     333.7  mm    G. Age:   38w 1d       85  %    HC/AC:      0.97   0.93 -

 AC:     345.8  mm    G. Age:   38w 3d     > 97  %    FL/BPD:     75.8   71 - 87
 FL:      69.7  mm    G. Age:   35w 5d       59  %    FL/AC:      20.2   20 - 24

 Est. FW:    5743  gm      7 lb 4 oz   > 90  %
Gestational Age

 Clinical EDD:  35w 1d                                        EDD:   03/30/12
 U/S Today:     37w 3d                                        EDD:   03/14/12
 Best:          35w 1d    Det. By:   Clinical EDD             EDD:   03/30/12
Anatomy

 Cranium:           Appears normal      Aortic Arch:       Not well
                                                           visualized
 Fetal Cavum:       Not well            Ductal Arch:       Not well
                    visualized                             visualized
 Ventricles:        Appears normal      Diaphragm:         Appears normal
 Choroid Plexus:    Not well            Stomach:           Appears
                    visualized                             normal, left
                                                           sided
 Cerebellum:        Not well            Abdomen:           Appears normal
                    visualized
 Posterior Fossa:   Not well            Abdominal Wall:    Appears nml
                    visualized                             (cord insert,
                                                           abd wall)
 Nuchal Fold:       Not applicable      Cord Vessels:      Appears normal
                    (>20 wks GA)                           (3 vessel cord)
 Face:              Orbits appear       Kidneys:           Appear normal
                    normal
 Heart:             Appears normal      Bladder:           Appears normal
                    (4 chamber &
                    axis)
 RVOT:              Not well            Spine:             Not well
                    visualized                             visualized
 LVOT:              Not well            Limbs:             Not well
                    visualized                             visualized

 Other:     Technically difficult due to advanced GA and fetal
            position.
Cervix Uterus Adnexa

 Cervix:       Not visualized (advanced GA >34 wks)

 Adnexa:     No abnormality visualized.
Impression

 Single live IUP in cephalic presentation.  Measuring 2 weeks
 ahead of assigned GA by LMP with EFW >90%ile. No late-
 developing anomaly in visualized structures above.

## 2012-08-05 ENCOUNTER — Telehealth: Payer: Self-pay | Admitting: Obstetrics and Gynecology

## 2012-08-06 NOTE — Telephone Encounter (Signed)
Spoke with pt rgd msg pt states delivered 4 months ago had first cycle October 2 pt states since starting cycle have had migraines for a week nausea and hot flashes wants eval pt has appt 08/19/12 at 9:00 with AR pt voice understanding

## 2012-08-19 ENCOUNTER — Ambulatory Visit (INDEPENDENT_AMBULATORY_CARE_PROVIDER_SITE_OTHER): Payer: BC Managed Care – PPO | Admitting: Obstetrics and Gynecology

## 2012-08-19 ENCOUNTER — Encounter: Payer: Self-pay | Admitting: Obstetrics and Gynecology

## 2012-08-19 VITALS — BP 110/72 | Ht 66.0 in | Wt 192.0 lb

## 2012-08-19 DIAGNOSIS — D259 Leiomyoma of uterus, unspecified: Secondary | ICD-10-CM

## 2012-08-19 DIAGNOSIS — N921 Excessive and frequent menstruation with irregular cycle: Secondary | ICD-10-CM

## 2012-08-19 DIAGNOSIS — D219 Benign neoplasm of connective and other soft tissue, unspecified: Secondary | ICD-10-CM

## 2012-08-19 DIAGNOSIS — N643 Galactorrhea not associated with childbirth: Secondary | ICD-10-CM

## 2012-08-19 DIAGNOSIS — Z139 Encounter for screening, unspecified: Secondary | ICD-10-CM

## 2012-08-19 DIAGNOSIS — N951 Menopausal and female climacteric states: Secondary | ICD-10-CM

## 2012-08-19 DIAGNOSIS — N920 Excessive and frequent menstruation with regular cycle: Secondary | ICD-10-CM

## 2012-08-19 LAB — CBC
MCHC: 34.3 g/dL (ref 30.0–36.0)
RDW: 13.5 % (ref 11.5–15.5)

## 2012-08-19 LAB — TSH: TSH: 1.16 u[IU]/mL (ref 0.350–4.500)

## 2012-08-19 LAB — FOLLICLE STIMULATING HORMONE: FSH: 9.9 m[IU]/mL

## 2012-08-19 LAB — PROLACTIN: Prolactin: 6.5 ng/mL

## 2012-08-19 MED ORDER — METOCLOPRAMIDE HCL 10 MG PO TABS: 10.0000 mg | ORAL_TABLET | Freq: Four times a day (QID) | ORAL | Status: DC | PRN

## 2012-08-19 NOTE — Progress Notes (Signed)
C/o 1st cycle since delivery on the 1st of Oct and then another cycle a few wks later.  Also c/o lactating, mood swings.  Reports h/o fibroids and menorrhagia.  Filed Vitals:   08/19/12 0906  BP: 110/72   A/P Check labs sched u/s Pelvic exam at [facility]

## 2012-08-20 LAB — VITAMIN D 25 HYDROXY (VIT D DEFICIENCY, FRACTURES): Vit D, 25-Hydroxy: 39 ng/mL (ref 30–89)

## 2012-08-26 ENCOUNTER — Telehealth: Payer: Self-pay | Admitting: Obstetrics and Gynecology

## 2012-08-26 NOTE — Telephone Encounter (Signed)
Tc to pt regarding msg, lm on vm to call back. 

## 2012-08-29 ENCOUNTER — Telehealth: Payer: Self-pay | Admitting: Obstetrics and Gynecology

## 2012-08-29 NOTE — Telephone Encounter (Signed)
Tc to pt per telephone call. Informed pt results are to still awaiting to be reviewed by AR. Pt aware of normal Vit D, TSH and CBC results. Pt wants FSH and Prolactin results. Will consult with AR per recs pt agrees

## 2012-09-02 ENCOUNTER — Telehealth: Payer: Self-pay

## 2012-09-02 NOTE — Telephone Encounter (Signed)
Spoke to pt to let her know that she should keep her appt for u/s and f/u later this month. Labs all looked good, so if pt's sx's persist, she would need to f/u in 6 months w/ U/S and f/up again w/ AR. Melody Comas A

## 2012-09-16 ENCOUNTER — Encounter: Payer: BC Managed Care – PPO | Admitting: Obstetrics and Gynecology

## 2012-09-16 ENCOUNTER — Other Ambulatory Visit: Payer: BC Managed Care – PPO

## 2012-12-02 ENCOUNTER — Other Ambulatory Visit: Payer: Self-pay

## 2012-12-02 ENCOUNTER — Ambulatory Visit: Payer: BC Managed Care – PPO

## 2012-12-02 DIAGNOSIS — Z3490 Encounter for supervision of normal pregnancy, unspecified, unspecified trimester: Secondary | ICD-10-CM

## 2012-12-02 DIAGNOSIS — Z331 Pregnant state, incidental: Secondary | ICD-10-CM

## 2012-12-02 LAB — HCG, QUANTITATIVE, PREGNANCY: hCG, Beta Chain, Quant, S: 228.3 m[IU]/mL

## 2012-12-03 ENCOUNTER — Telehealth: Payer: Self-pay | Admitting: Obstetrics and Gynecology

## 2012-12-03 LAB — PRENATAL PANEL VII
Antibody Screen: NEGATIVE
Basophils Absolute: 0 10*3/uL (ref 0.0–0.1)
Basophils Relative: 0 % (ref 0–1)
Eosinophils Absolute: 0.1 10*3/uL (ref 0.0–0.7)
HCT: 37.6 % (ref 36.0–46.0)
HIV: NONREACTIVE
MCHC: 33.8 g/dL (ref 30.0–36.0)
Monocytes Absolute: 0.8 10*3/uL (ref 0.1–1.0)
Neutro Abs: 5.9 10*3/uL (ref 1.7–7.7)
Neutrophils Relative %: 62 % (ref 43–77)
RDW: 13 % (ref 11.5–15.5)
Rh Type: POSITIVE

## 2012-12-03 NOTE — Telephone Encounter (Signed)
Pt wants test results

## 2012-12-03 NOTE — Telephone Encounter (Signed)
Tc to pt regarding msg.  Informed progesterone @ 17.2 and QHCG 228.2 and will need to repeat in 48 hours.  However, d/t weather may not be able to recheck in 48 hours, so will go when roads are safe to travel on.

## 2012-12-04 LAB — CULTURE, OB URINE: Colony Count: 55000

## 2012-12-05 ENCOUNTER — Telehealth: Payer: Self-pay | Admitting: Obstetrics and Gynecology

## 2012-12-05 NOTE — Telephone Encounter (Signed)
TC from pt. States was unable to have [facility] done. [facility] in [place] is closed. Per Stevan Born, pt can go to [facility]. Pt has order. Pt agreeable. States no bleeding. Had cramping 12/04/12. Better today. Advised increased water. To go to nearest hospital (pt lives in [place]) with any increase in cramping or bleeding. Pt verbalizes comprehension.

## 2012-12-08 ENCOUNTER — Other Ambulatory Visit: Payer: Self-pay | Admitting: Obstetrics and Gynecology

## 2012-12-08 ENCOUNTER — Telehealth: Payer: Self-pay | Admitting: Obstetrics and Gynecology

## 2012-12-08 NOTE — Telephone Encounter (Signed)
TC to pt. After consult with DR AR, informed [facility] is rising but will need to repeat 2x , 48 hours apart to be sure is appropriate.  May have Rx for same Progesterone as previously. Will order 12/09/12. Orders faxed to [place], [place]. For [facility] 2/18/ and 11/2012. Pt denies any cramping or bleeding. To call with any concers.

## 2012-12-09 ENCOUNTER — Other Ambulatory Visit: Payer: Self-pay | Admitting: Obstetrics and Gynecology

## 2012-12-09 MED ORDER — PROGESTERONE 25 MG VA SUPP: 25.0000 mg | Freq: Two times a day (BID) | VAGINAL | Status: DC

## 2012-12-09 NOTE — Telephone Encounter (Signed)
After phone consult with Dr Alinda Sierras, TC to pt. Informed QHCG is sufficient that does not need repeat [facility] 12/11/12. To have U/S scheduled. Appt 12/11/12 and F/U with CA. Pt prefers Progesterone supp instead of IM.  Rx called to [facility].  Will obtain 12/11/12. Pt to call with any concerns. Pt verbalizes comprehension.

## 2012-12-11 ENCOUNTER — Ambulatory Visit: Payer: BC Managed Care – PPO | Admitting: Certified Nurse Midwife

## 2012-12-11 ENCOUNTER — Other Ambulatory Visit: Payer: Self-pay | Admitting: Obstetrics and Gynecology

## 2012-12-11 ENCOUNTER — Encounter: Payer: Self-pay | Admitting: Certified Nurse Midwife

## 2012-12-11 ENCOUNTER — Ambulatory Visit: Payer: BC Managed Care – PPO

## 2012-12-11 VITALS — BP 104/62 | HR 68 | Wt 194.0 lb

## 2012-12-11 DIAGNOSIS — D219 Benign neoplasm of connective and other soft tissue, unspecified: Secondary | ICD-10-CM

## 2012-12-11 DIAGNOSIS — O3680X0 Pregnancy with inconclusive fetal viability, not applicable or unspecified: Secondary | ICD-10-CM

## 2012-12-11 DIAGNOSIS — N921 Excessive and frequent menstruation with irregular cycle: Secondary | ICD-10-CM

## 2012-12-11 LAB — US OB TRANSVAGINAL

## 2012-12-11 NOTE — Progress Notes (Signed)
[redacted]w[redacted]d Ultrasound:  yolk sac seen, S=D2w5d

## 2012-12-11 NOTE — Progress Notes (Signed)
Pt. Here with husband for review of [place] to confirm viability. Denies pain or bleeding Taking progesterone for low values Has scheduled NOB PE US- WNL for gest. Age of 5 weeks and 5 days   Single gestational sac with a yolk sac present  Plan:  Follow up US two weeks FHT check and viability    Continue progesterone  Keep scheduled OB PE C. Maribel Hadley, CNM

## 2012-12-11 NOTE — Progress Notes (Signed)
Pt. Here to review [place] for viability

## 2012-12-11 NOTE — Addendum Note (Signed)
Addended by: Willaim Sheng on: 12/11/2012 09:29 AM   Modules accepted: Orders

## 2012-12-25 ENCOUNTER — Ambulatory Visit: Payer: BC Managed Care – PPO

## 2012-12-25 ENCOUNTER — Ambulatory Visit: Payer: BC Managed Care – PPO | Admitting: Certified Nurse Midwife

## 2012-12-25 ENCOUNTER — Other Ambulatory Visit: Payer: Self-pay | Admitting: Certified Nurse Midwife

## 2012-12-25 VITALS — BP 102/68 | Temp 98.4°F | Wt 192.0 lb

## 2012-12-25 LAB — US OB COMP LESS 14 WKS

## 2012-12-25 NOTE — Progress Notes (Signed)
Pt. Here to follow [place] for viability.  7 wks and one day with [facility] 08-12-2013.[place] reviewed and FHt noted at 7 weeks and one day gesation. No further spotting or paing.  Pt. Without complaints. Elects a  Quad screen when appropriate.  Declines first trimester screen.   Discussed first trimester and second trimeter pg. With pt. And husband.  [place]- Viable  7 weeks and one day with [facility] 08-12-2013.  Plan: keep appt. With VL for New OB WU  C. Armer, CNM

## 2012-12-25 NOTE — Progress Notes (Signed)
Pt is here today for viability .  Ultrasound:  EGA: 7 weeks + 1 days, S=D Comments: Yolk sac and amnion seen, normal ovaries,no fluid in the CDS, normal adenexas.

## 2012-12-29 LAB — US OB TRANSVAGINAL

## 2013-01-04 ENCOUNTER — Encounter (HOSPITAL_COMMUNITY): Payer: Self-pay | Admitting: *Deleted

## 2013-01-04 ENCOUNTER — Inpatient Hospital Stay (HOSPITAL_COMMUNITY)
Admission: AD | Admit: 2013-01-04 | Discharge: 2013-01-04 | Disposition: A | Discharge status: Home / Self Care | Payer: BC Managed Care – PPO | Source: Ambulatory Visit | Attending: Obstetrics and Gynecology | Admitting: Obstetrics and Gynecology

## 2013-01-04 ENCOUNTER — Inpatient Hospital Stay (HOSPITAL_COMMUNITY): Payer: BC Managed Care – PPO

## 2013-01-04 DIAGNOSIS — O209 Hemorrhage in early pregnancy, unspecified: Secondary | ICD-10-CM | POA: Insufficient documentation

## 2013-01-04 LAB — URINALYSIS, ROUTINE W REFLEX MICROSCOPIC
Bilirubin Urine: NEGATIVE
Nitrite: NEGATIVE
Protein, ur: NEGATIVE mg/dL
Urobilinogen, UA: 0.2 mg/dL (ref 0.0–1.0)

## 2013-01-04 LAB — URINE MICROSCOPIC-ADD ON

## 2013-01-04 LAB — WET PREP, GENITAL
Clue Cells Wet Prep HPF POC: NONE SEEN
Trich, Wet Prep: NONE SEEN

## 2013-01-04 MED ORDER — DOXYLAMINE-PYRIDOXINE 10-10 MG PO TBEC: 2.0000 | DELAYED_RELEASE_TABLET | Freq: Every day | ORAL | Status: DC

## 2013-01-04 NOTE — MAU Provider Note (Signed)
History     CSN: [id number]  Arrival date and time: 01/04/13 1853   None     Chief Complaint  Patient presents with  . Vaginal Bleeding   HPI Comments: Pt is a G2P1 at [redacted]w[redacted]d arrives w CC of vaginal bleeding that started about 6pm, was moderate/heavy but has since decreased to scant/light. She denies any pain. She has a history of infertility in prior pregnancy with spontaneous conception this pregnancy. Has had 2 prior US with viability confirmed at 7wks.      Past Medical History  Diagnosis Date  . Difficulty waking 2011    difficulty waking up  . GERD (gastroesophageal reflux disease)   . Complication of anesthesia     trouble waking up at last surgery  . Fibroid 2001  . H/O varicella   . H/O infertility     IVF 2012  . Irritable bowel syndrome   . Hiatal hernia   . Neck injury 2011    Past Surgical History  Procedure Laterality Date  . Myomectomy abdominal approach  2001  . Tonsillectomy    . Neck surgery  2011  . Back surgery  2002  . Cesarean section  03/14/2012    Procedure: CESAREAN SECTION;  Surgeon: Michael Litter, MD;  Location: [facility];  Service: Gynecology;  Laterality: N/A;  . Wisdom tooth extraction  1997    Family History  Problem Relation Age of Onset  . Diabetes Sister     IDDM  . Cancer Paternal Grandmother     Skin  . Stroke Paternal Grandfather   . Other Mother     Varicose veins  . Other Father     Blood clots  . Seizures Cousin   . Parkinson's disease Maternal Grandfather   . Stroke Paternal Grandmother   . Fibromyalgia Sister     History  Substance Use Topics  . Smoking status: Former Games developer  . Smokeless tobacco: Never Used  . Alcohol Use: No    Allergies:  Allergies  Allergen Reactions  . Codeine Nausea And Vomiting  . Cephalexin Rash    Prescriptions prior to admission  Medication Sig Dispense Refill  . loratadine (CLARITIN) 10 MG tablet Take 10 mg by mouth at bedtime.      Marland Kitchen omeprazole (PRILOSEC) 10 MG capsule  Take 10 mg by mouth daily.      . Prenatal Vit-Fe Fumarate-FA (PRENATAL MULTIVITAMIN) TABS Take 1 tablet by mouth at bedtime.       . Progesterone 25 MG SUPP Place 1 suppository (25 mg total) vaginally 2 (two) times daily.  60 suppository  2  . [DISCONTINUED] diphenhydrAMINE (BENADRYL) 25 MG tablet Take 25 mg by mouth every 6 (six) hours as needed for sleep.        Review of Systems  Gastrointestinal: Positive for nausea and vomiting.  All other systems reviewed and are negative.   Physical Exam   Blood pressure 131/76, pulse 90, temperature 98.6 F (37 C), temperature source Oral, resp. rate 18, last menstrual period 11/05/2012.  Physical Exam  Nursing note and vitals reviewed. Constitutional: She is oriented to person, place, and time. She appears well-developed and well-nourished.  HENT:  Head: Normocephalic.  Cardiovascular: Normal rate.   Respiratory: Effort normal.  GI: Soft. She exhibits no distension. There is no tenderness.  Genitourinary:  sm amt BRB in vagina, cervix otherwise appears normal,   Musculoskeletal: Normal range of motion.  Neurological: She is alert and oriented to person, place, and time. She  has normal reflexes.  Skin: Skin is warm and dry.  Psychiatric: She has a normal mood and affect. Her behavior is normal.   [place] +FHT's, small SCH noted, otherwise normal   MAU Course  Procedures    Assessment and Plan  IUP at [redacted]w[redacted]d [facility] Wet prep neg  Discharge home Bleeding precautions Hyperemesis rv'd  F/u routine D/w Dr Waylan Rocher M 01/04/2013, 9:00 PM

## 2013-01-04 NOTE — MAU Note (Signed)
Pt presents with complaints of bright red vaginal bleeding with clots since 6pm. Denies any cramping

## 2013-04-24 IMAGING — US US OB COMP LESS 14 WK
1 series · 14 of 21 positions shown · non-contrast
Comparison: None.

CLINICAL DATA: Early pregnancy.  Bleeding.

OBSTETRIC <14 WK ULTRASOUND
TECHNIQUE: Transabdominal ultrasound was performed for evaluation
of the gestation as well as the maternal uterus and adnexal
regions.

[Series 1: us ob comp less 14 wks · 14 of 21 slices shown]
[im 1/21]
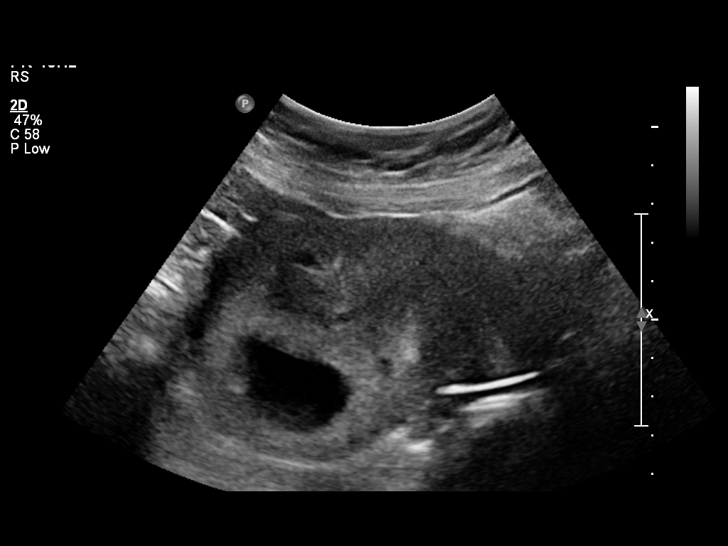
[im 3/21]
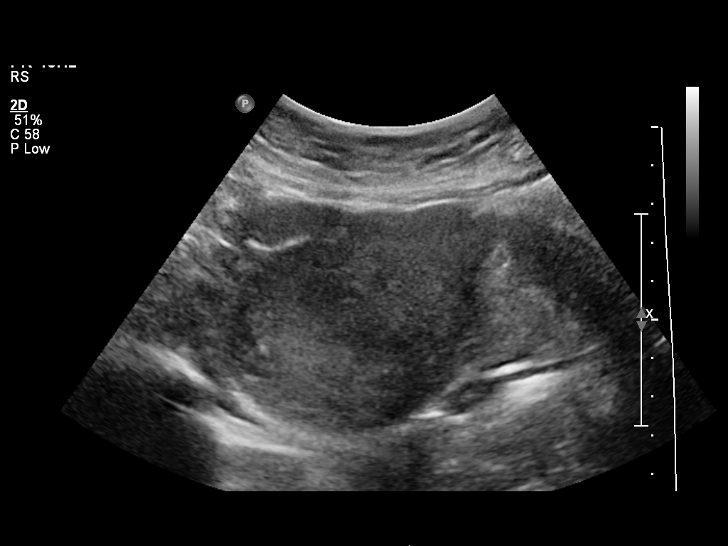
[im 4/21]
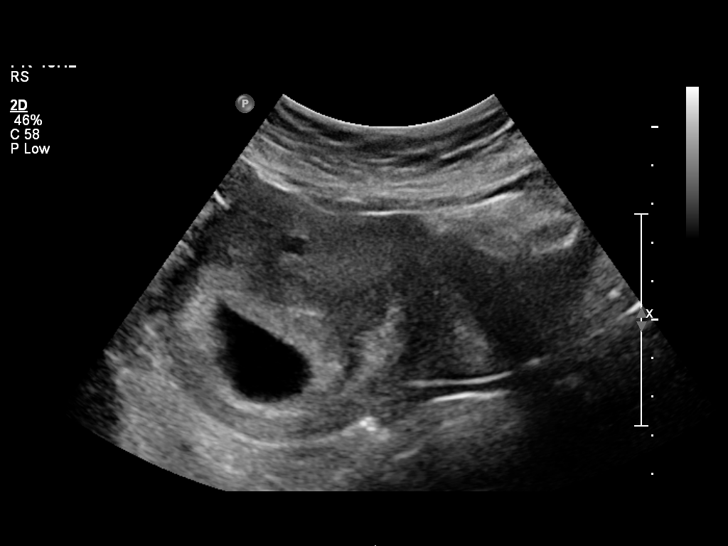
[im 6/21]
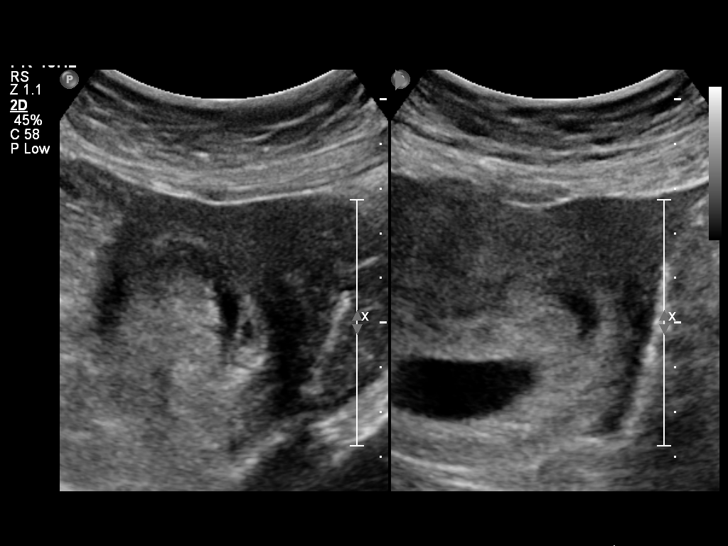
[im 7/21]
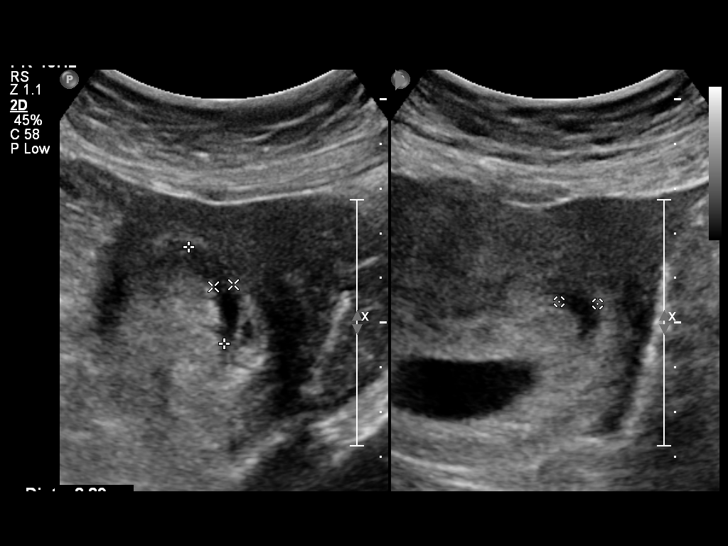
[im 9/21]
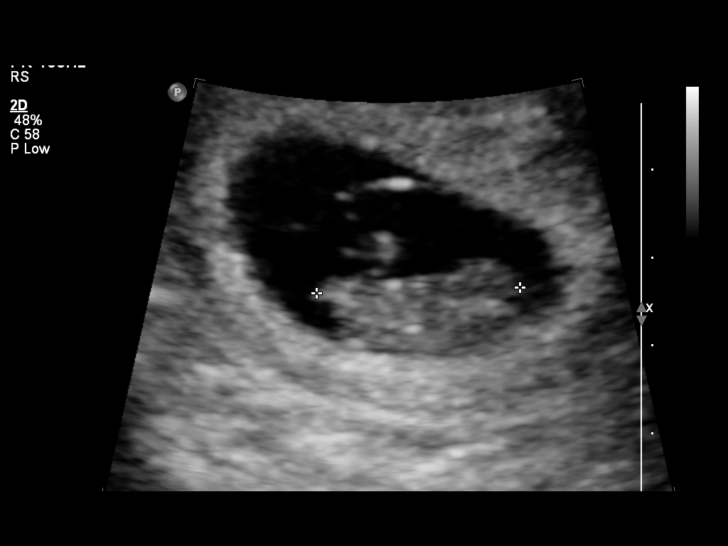
[im 10/21]
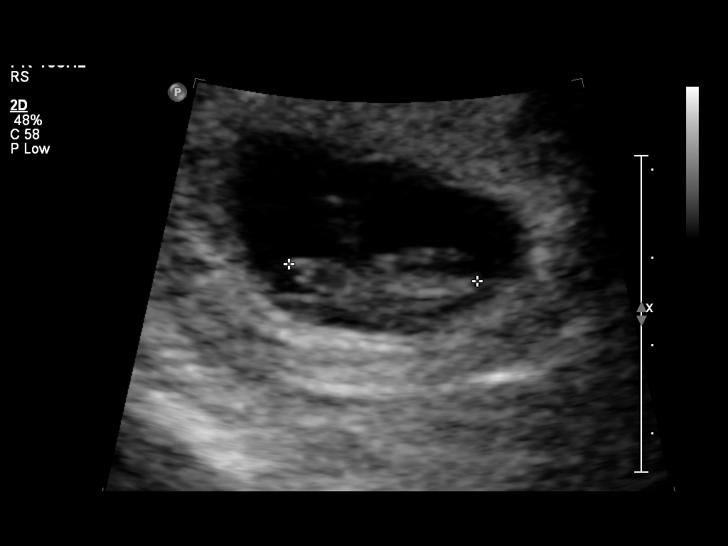
[im 12/21]
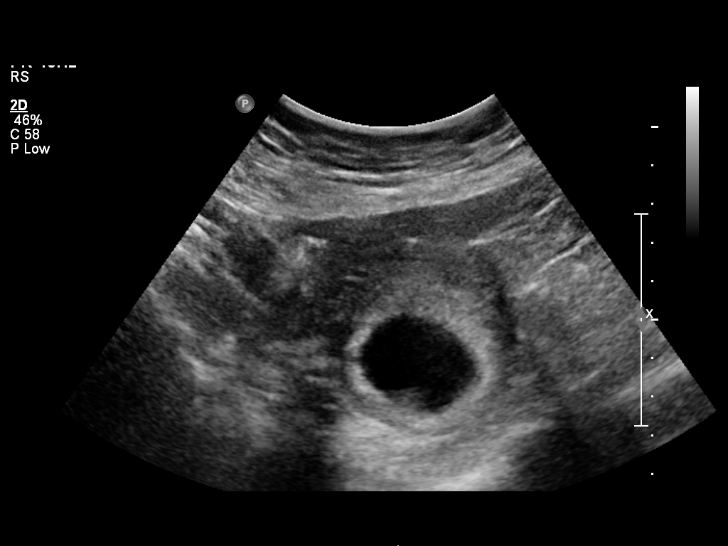
[im 13/21]
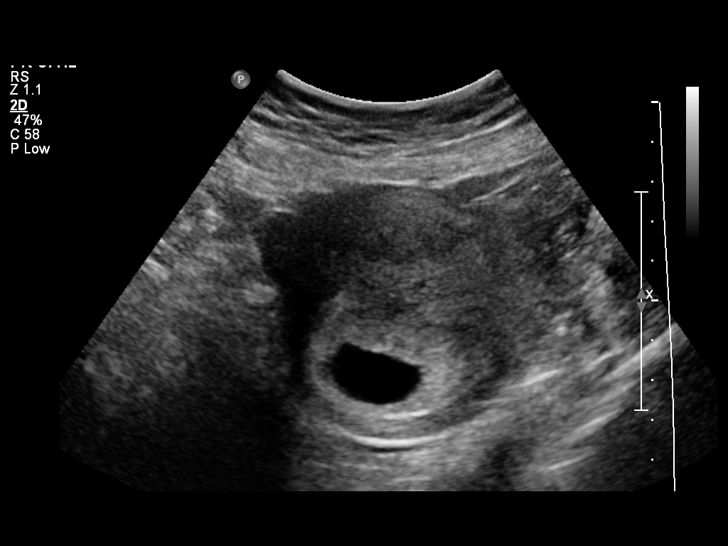
[im 15/21]
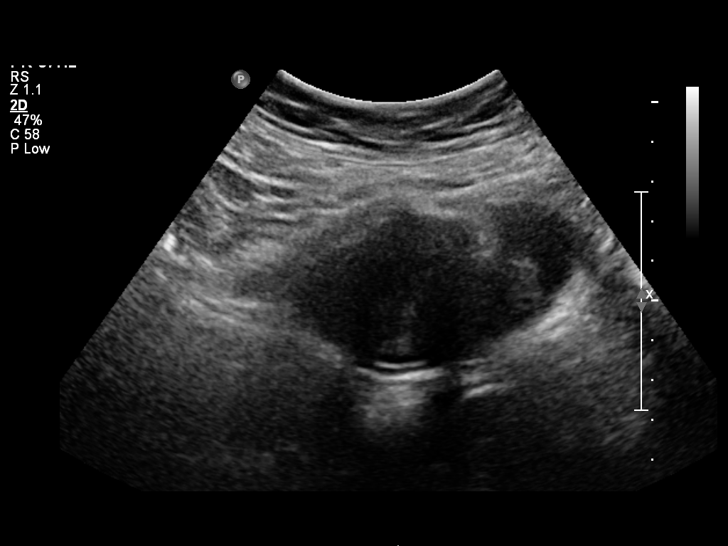
[im 16/21]
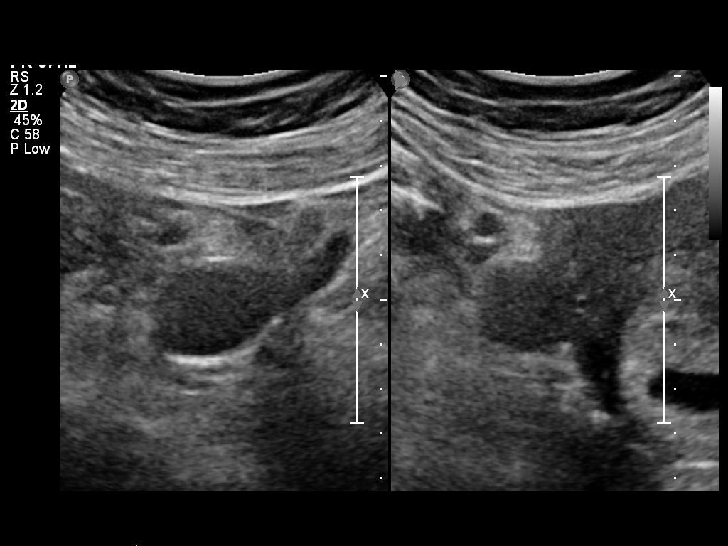
[im 18/21]
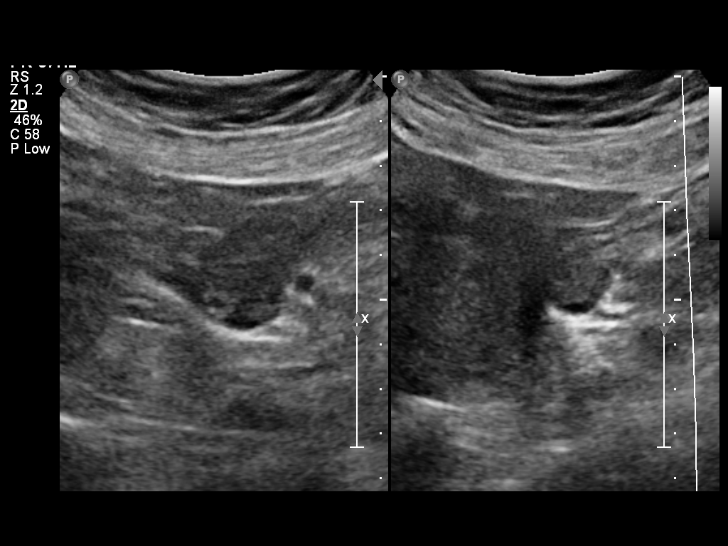
[im 19/21]
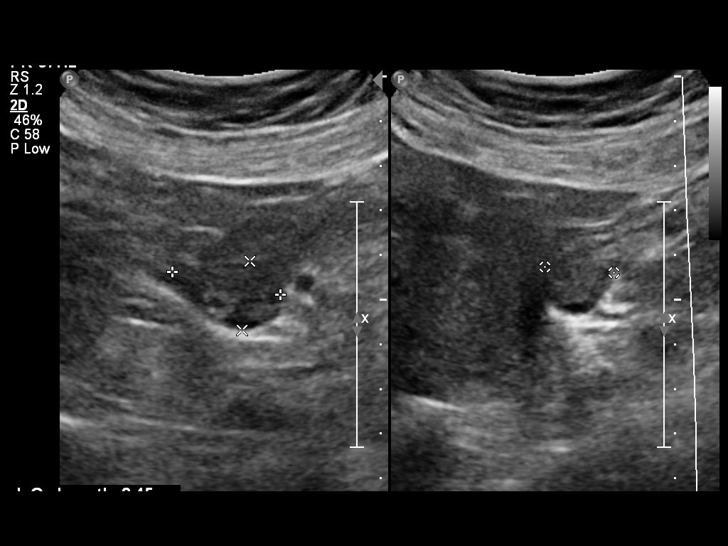
[im 21/21]
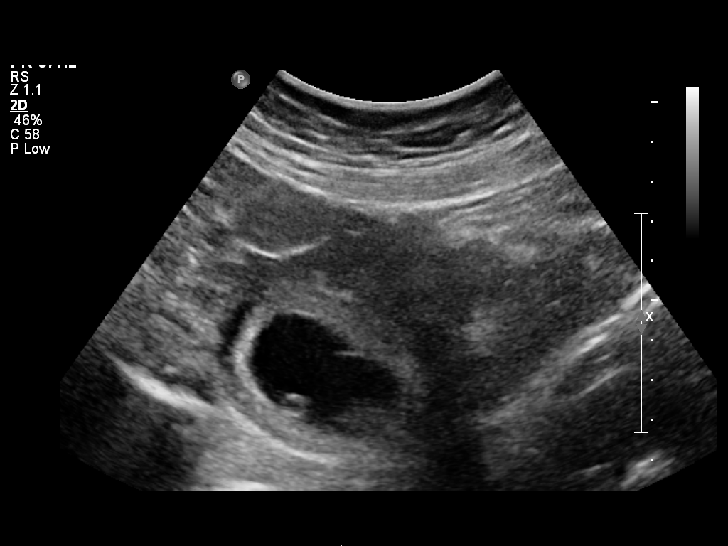

[14 of 21 positions shown; findings below may reference images not displayed]

Intrauterine gestational sac: Single.
Yolk sac: Yes
Embryo: Yes
Cardiac Activity: Yes
Heart Rate: 172 bpm

CRL:  22 mm  9 w  0 d         US EDC: 08/12/2013

Maternal uterus/Adnexae:
There is a small subchorionic hemorrhage.  The ovaries are normal.
The uterus is retroverted.
IMPRESSION: Intrauterine pregnancy of 9 weeks 0 days gestation. Small
subchorionic hemorrhage.

## 2013-06-05 ENCOUNTER — Encounter (HOSPITAL_COMMUNITY): Payer: Self-pay | Admitting: *Deleted

## 2013-06-05 ENCOUNTER — Inpatient Hospital Stay (HOSPITAL_COMMUNITY)
Admission: AD | Admit: 2013-06-05 | Discharge: 2013-06-05 | Disposition: A | Discharge status: Home / Self Care | Payer: No Typology Code available for payment source | Source: Ambulatory Visit | Attending: Obstetrics and Gynecology | Admitting: Obstetrics and Gynecology

## 2013-06-05 DIAGNOSIS — R109 Unspecified abdominal pain: Secondary | ICD-10-CM | POA: Insufficient documentation

## 2013-06-05 DIAGNOSIS — B009 Herpesviral infection, unspecified: Secondary | ICD-10-CM | POA: Diagnosis not present

## 2013-06-05 DIAGNOSIS — Z98891 History of uterine scar from previous surgery: Secondary | ICD-10-CM

## 2013-06-05 DIAGNOSIS — O4700 False labor before 37 completed weeks of gestation, unspecified trimester: Secondary | ICD-10-CM | POA: Insufficient documentation

## 2013-06-05 LAB — AMNISURE RUPTURE OF MEMBRANE (ROM) NOT AT ARMC: Amnisure ROM: NEGATIVE

## 2013-06-05 MED ORDER — TERBUTALINE SULFATE 1 MG/ML IJ SOLN
0.2500 mg | Freq: Once | INTRAMUSCULAR | Status: AC
  Administered 2013-06-05: 0.25 mg via SUBCUTANEOUS

## 2013-06-05 NOTE — MAU Provider Note (Signed)
History   37 yo G2P1001 at 30 4/7 weeks presented unannounced c/o increased cramping today--seen in office yesterday with cramping.  Was having contractions on the monitor.  Cervix closed, with negative FFN.  Rx'd Procardia 10 mg po q 6 hours.  Notes "sharp, electrical shock type pain" is gone, but reports low menstrual type cramping has continued.  Patient very anxious due to hx of myomectomy and primary C/S with last delivery, and lives in [place], [place].  Concerned that labor could ensue and fearful of distance from [facility].  Some increased wetness last 24 hours, but no fluid running down legs or bleeding.  Reports +FM, denies dysuria.  Patient Active Problem List   Diagnosis Date Noted  . 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 Previous cesarean section 06/07/2013  . AMA (advanced maternal age) multigravida 35+ 02/04/2012  . Pregnancy w/ hx of uterine myomectomy 02/04/2012  . 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 Labor Eval    OB History   Grav Para Term Preterm Abortions TAB SAB Ect Mult Living   2 1 1       1       Past Medical History  Diagnosis Date  . Difficulty waking 2011    difficulty waking up  . GERD (gastroesophageal reflux disease)   . Complication of anesthesia     trouble waking up at last surgery  . Fibroid 2001  . H/O varicella   . H/O infertility     IVF 2012  . Irritable bowel syndrome   . Hiatal hernia   . Neck injury 2011    Past Surgical History  Procedure Laterality Date  . Myomectomy abdominal approach  2001  . Tonsillectomy    . Neck surgery  2011  . Back surgery  2002  . Cesarean section  03/14/2012    Procedure: CESAREAN SECTION;  Surgeon: Michael Litter, MD;  Location: [facility];  Service: Gynecology;  Laterality: N/A;  . Wisdom tooth extraction  1997    Family History  Problem Relation Age of Onset  . Diabetes Sister     IDDM  . Cancer Paternal Grandmother     Skin  . Stroke Paternal Grandfather   . Other  Mother     Varicose veins  . Other Father     Blood clots  . Seizures Cousin   . Parkinson's disease Maternal Grandfather   . Stroke Paternal Grandmother   . Fibromyalgia Sister     History  Substance Use Topics  . Smoking status: Former Games developer  . Smokeless tobacco: Never Used  . Alcohol Use: No    Allergies:  Allergies  Allergen Reactions  . Codeine Nausea And Vomiting  . Cephalexin Rash    No prescriptions prior to admission    Physical Exam   Blood pressure 103/70, pulse 111, temperature 98.6 F (37 C), temperature source Oral, resp. rate 16, last menstrual period 11/05/2012, SpO2 97.00%.  Chest clear Heart RRR without murmur Abd gravid, NT Pelvic--no d/c in vault, cervix closed, long, vtx -2. Ext WNL  FHR Category 1 UCs--no defined UCs, sporadic irritability.  UA negative yesterday Urine culture sent  ED Course  IUP at 30 4/7 weeks PT uterine irritability  Plan: Amnisure Terbutaline 0.25 mg now Observe status.   Nigel Bridgeman CNM, [place] 06/07/2013 11:28 AM  Addendum:  Feeling less cramping after Terbutaline. Amnisure negative  FHR Category 1 Mild irritability at times.  D/C'd home. Take Procardia 10  mg po q 4 hours x 24 hours (may stretch back out to q 6 hours if cramping minimal). Push po fluids. Keep scheduled appt on Tuesday for recheck of status. S/S PTL reviewed. Patient questions whether she can start Valtrex prophylaxis earlier than 34 weeks--has had approx 5 outbreaks during pregnancy--OK to start suppressive therapy now, if she desires.  Has current Rx.  Nigel Bridgeman, CNM 06/05/13  6p

## 2013-06-05 NOTE — MAU Note (Signed)
Patient state she is having contractions every 5 minutes with an increase in vaginal discharge. Patient is on Procardia and last dose at 0930. Reports good fetal movement, no bleeding.

## 2013-06-05 NOTE — MAU Note (Signed)
Pt reports uc's since yesterday. Also pt reports changing her panties 2-3 times per day because of increased discharge. Pt denies vaginal bleeding. Pt is on bedrest since yesterday and is taking po procardia, due for next dose at 15:30. Pt reports persistent uc's despite procardia. Pt reports constant pelvic pressure. Pt states that even "when I go to the bathroom, it feels like I still need to go all the time"

## 2013-06-07 DIAGNOSIS — Z98891 History of uterine scar from previous surgery: Secondary | ICD-10-CM

## 2013-06-07 DIAGNOSIS — B009 Herpesviral infection, unspecified: Secondary | ICD-10-CM | POA: Diagnosis not present

## 2013-07-10 ENCOUNTER — Encounter (HOSPITAL_COMMUNITY): Payer: Self-pay | Admitting: *Deleted

## 2013-07-10 ENCOUNTER — Inpatient Hospital Stay (HOSPITAL_COMMUNITY)
Admission: AD | Admit: 2013-07-10 | Discharge: 2013-07-11 | Disposition: A | Discharge status: Home / Self Care | Payer: No Typology Code available for payment source | Source: Ambulatory Visit | Attending: Obstetrics and Gynecology | Admitting: Obstetrics and Gynecology

## 2013-07-10 DIAGNOSIS — O09219 Supervision of pregnancy with history of pre-term labor, unspecified trimester: Secondary | ICD-10-CM | POA: Insufficient documentation

## 2013-07-10 DIAGNOSIS — O200 Threatened abortion: Secondary | ICD-10-CM | POA: Insufficient documentation

## 2013-07-10 DIAGNOSIS — D259 Leiomyoma of uterus, unspecified: Secondary | ICD-10-CM | POA: Insufficient documentation

## 2013-07-10 DIAGNOSIS — O34219 Maternal care for unspecified type scar from previous cesarean delivery: Secondary | ICD-10-CM | POA: Insufficient documentation

## 2013-07-10 DIAGNOSIS — K449 Diaphragmatic hernia without obstruction or gangrene: Secondary | ICD-10-CM | POA: Insufficient documentation

## 2013-07-10 DIAGNOSIS — O26849 Uterine size-date discrepancy, unspecified trimester: Secondary | ICD-10-CM | POA: Insufficient documentation

## 2013-07-10 DIAGNOSIS — O2390 Unspecified genitourinary tract infection in pregnancy, unspecified trimester: Secondary | ICD-10-CM | POA: Insufficient documentation

## 2013-07-10 DIAGNOSIS — R109 Unspecified abdominal pain: Secondary | ICD-10-CM | POA: Insufficient documentation

## 2013-07-10 DIAGNOSIS — B3731 Acute candidiasis of vulva and vagina: Secondary | ICD-10-CM | POA: Insufficient documentation

## 2013-07-10 DIAGNOSIS — B373 Candidiasis of vulva and vagina: Secondary | ICD-10-CM | POA: Insufficient documentation

## 2013-07-10 DIAGNOSIS — O4700 False labor before 37 completed weeks of gestation, unspecified trimester: Secondary | ICD-10-CM | POA: Insufficient documentation

## 2013-07-10 DIAGNOSIS — N979 Female infertility, unspecified: Secondary | ICD-10-CM | POA: Insufficient documentation

## 2013-07-10 LAB — URINALYSIS, ROUTINE W REFLEX MICROSCOPIC
Bilirubin Urine: NEGATIVE
Hgb urine dipstick: NEGATIVE
Nitrite: NEGATIVE
Specific Gravity, Urine: 1.01 (ref 1.005–1.030)
pH: 6 (ref 5.0–8.0)

## 2013-07-10 LAB — WET PREP, GENITAL
Trich, Wet Prep: NONE SEEN
Yeast Wet Prep HPF POC: NONE SEEN

## 2013-07-10 MED ORDER — LACTATED RINGERS IV BOLUS (SEPSIS)
1000.0000 mL | Freq: Once | INTRAVENOUS | Status: AC
  Administered 2013-07-10: 1000 mL via INTRAVENOUS

## 2013-07-10 MED ORDER — OXYCODONE-ACETAMINOPHEN 5-325 MG PO TABS
2.0000 | ORAL_TABLET | Freq: Once | ORAL | Status: AC
  Administered 2013-07-11: 2 via ORAL
  Filled 2013-07-10 (×2): qty 2

## 2013-07-10 NOTE — MAU Note (Addendum)
Contractions since last night. Started feeling bad and have felt bad all day. I've had problems with preterm labor. I have Procardia at home but have not taken any. I've also been leaking alittle bit of fluid all day

## 2013-07-10 NOTE — MAU Provider Note (Signed)
History   37yo G2P1001 at [redacted]w[redacted]d presents to MAU with c/o menstrual cramping which moves to her back, recent diarrhea and nausea.  Pt also reports LOF, though not sure its amniotic fluid.  Called over the weekend thinking she had a yeast infection and tried OTC treatments with relief.  Seen about 5 weeks ago for PTL, rx'd Procardia though has not been taking the last couple of weeks. Denies VB, recent fever, resp or GI c/o's, UTI or PIH s/s. GFM.   Planning a repeat C/S.  Chief Complaint  Patient presents with  . Contractions  . Rupture of Membranes   Patient Active Problem List   Diagnosis Date Noted  . Hx of PTL (preterm labor), this pregnancy 07/10/2013  . Uterine leiomyoma 07/10/2013  . Hiatal hernia 07/10/2013  . Threatened abortion in first trimester 07/10/2013  . Uterine size date discrepancy 07/10/2013  . History of cesarean delivery, currently pregnant 07/10/2013  . 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 Previous cesarean section 06/07/2013  . AMA (advanced maternal age) multigravida 35+ 02/04/2012  . Pregnancy w/ hx of uterine myomectomy 02/04/2012  . 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 Difficulty waking 2011    difficulty waking up  . GERD (gastroesophageal reflux disease)   . Complication of anesthesia     trouble waking up at last surgery  . Fibroid 2001  . H/O varicella   . H/O infertility     IVF 2012  . Irritable bowel syndrome   . Hiatal hernia   . Neck injury 2011    Past Surgical History  Procedure Laterality Date  . Myomectomy abdominal approach  2001  . Tonsillectomy    . Neck surgery  2011  . Back surgery  2002  . Cesarean section  03/14/2012    Procedure: CESAREAN SECTION;  Surgeon: Michael Litter, MD;  Location: [facility];  Service: Gynecology;  Laterality: N/A;  . Wisdom tooth extraction  1997    Family History  Problem  Relation Age of Onset  . Diabetes Sister     IDDM  . Cancer Paternal Grandmother     Skin  . Stroke Paternal Grandfather   . Other Mother     Varicose veins  . Other Father     Blood clots  . Seizures Cousin   . Parkinson's disease Maternal Grandfather   . Stroke Paternal Grandmother   . Fibromyalgia Sister     History  Substance Use Topics  . Smoking status: Former Games developer  . Smokeless tobacco: Never Used  . Alcohol Use: No    Allergies:  Allergies  Allergen Reactions  . Codeine Nausea And Vomiting  . Cephalexin Rash    Prescriptions prior to admission  Medication Sig Dispense Refill  . IRON PO Take 1 tablet by mouth daily.      Marland Kitchen loratadine (CLARITIN) 10 MG tablet Take 10 mg by mouth at bedtime.      Marland Kitchen omeprazole (PRILOSEC) 10 MG capsule Take 10 mg by mouth at bedtime.       . Prenatal Vit-Fe Fumarate-FA (PRENATAL MULTIVITAMIN) TABS Take 1 tablet by mouth at bedtime.       . valACYclovir (VALTREX) 500 MG tablet Take 500 mg by mouth daily.        ROS:  see HPI above, all other systems are negative   Physical Exam   Blood pressure 125/73, pulse 96, temperature 98.1 F (36.7 C), resp. rate 20, height 5\' 7"  (1.702 m), weight 232 lb 9.6 oz (105.507 kg), last menstrual period 11/05/2012.  Chest: Clear Heart: RRR Abdomen: gravid, NT Extremities: WNL  Dilation: Fingertip Effacement (%): 20 Presentation: Vertex Exam by:: J.Krishiv Sandler,CNM  FHT: Reactive NST UCs: Q 5-7 min  ED Course  IUP at [redacted]w[redacted]d ? Labor ? LOF Recent yeast infection  Amnisure - neg Wet prep - neg CTO UCs and for cervical change Pain control - declined  No cervical change D/c home with precautions Percocet 2 tabs once before leaving F/u at already scheduled ROB 07/13/13   Haroldine Laws CNM, MSN 07/10/2013 10:30 PM

## 2013-07-11 MED ORDER — VALACYCLOVIR HCL 500 MG PO TABS: 500.0000 mg | ORAL_TABLET | Freq: Every day | ORAL | Status: DC

## 2013-07-15 ENCOUNTER — Other Ambulatory Visit: Payer: Self-pay | Admitting: Obstetrics and Gynecology

## 2013-07-15 NOTE — Addendum Note (Signed)
Addended by: Jaymes Graff on: 07/15/2013 02:54 PM   Modules accepted: Orders

## 2013-07-28 ENCOUNTER — Encounter (HOSPITAL_COMMUNITY): Payer: Self-pay | Admitting: Pharmacist

## 2013-07-28 ENCOUNTER — Other Ambulatory Visit (HOSPITAL_COMMUNITY): Payer: Self-pay | Admitting: Certified Nurse Midwife

## 2013-07-28 ENCOUNTER — Other Ambulatory Visit (HOSPITAL_COMMUNITY): Payer: Self-pay | Admitting: Obstetrics and Gynecology

## 2013-07-28 MED ORDER — NON FORMULARY: 1.0000 | Freq: Every morning | Status: DC

## 2013-07-31 NOTE — Patient Instructions (Addendum)
JELIYAH MIDDLEBROOKS  08/03/2013   Your procedure is scheduled on: August 05, 2013  Enter through the Main Entrance of [facility] at 1:00 pm  Pick up the phone at the desk and dial [phone].   Call this number if you have problems the morning of surgery: [phone]   Remember:   Do not eat food:After Midnight.  Do not drink clear liquids: After Midnight.  Take these medicines the morning of surgery with A SIP OF WATER: no medications    Do not wear jewelry, make-up or nail polish.  Do not wear lotions, powders, or perfumes. You may wear deodorant.  Do not shave 48 hours prior to surgery.  Do not bring valuables to the hospital.  [facility] is not   responsible for any belongings or valuables brought to the hospital.  Contacts, dentures or bridgework may not be worn into surgery.  Leave suitcase in the car. After surgery it may be brought to your room.  For patients admitted to the hospital, checkout time is 11:00 AM the day of              discharge.   Patients discharged the day of surgery will not be allowed to drive             home.  Name and phone number of your driver: Christiane Ha   Special Instructions:   Shower using CHG 2 nights before surgery and the night before surgery.  If you shower the day of surgery use CHG.  Use special wash - you have one bottle of CHG for all showers.  You should use approximately 1/3 of the bottle for each shower.   Please read over the following fact sheets that you were given: skin to skin, preparing for surgery

## 2013-08-03 ENCOUNTER — Encounter (HOSPITAL_COMMUNITY)
Admission: RE | Admit: 2013-08-03 | Discharge: 2013-08-03 | Disposition: A | Discharge status: Home / Self Care | Payer: No Typology Code available for payment source | Source: Ambulatory Visit | Attending: Obstetrics and Gynecology | Admitting: Obstetrics and Gynecology

## 2013-08-03 ENCOUNTER — Inpatient Hospital Stay (HOSPITAL_COMMUNITY)
Admission: AD | Admit: 2013-08-03 | Discharge: 2013-08-03 | Disposition: A | Discharge status: Home / Self Care | Payer: No Typology Code available for payment source | Source: Ambulatory Visit | Attending: Obstetrics and Gynecology | Admitting: Obstetrics and Gynecology

## 2013-08-03 ENCOUNTER — Encounter (HOSPITAL_COMMUNITY): Payer: Self-pay | Admitting: *Deleted

## 2013-08-03 ENCOUNTER — Encounter (HOSPITAL_COMMUNITY): Payer: Self-pay

## 2013-08-03 DIAGNOSIS — O479 False labor, unspecified: Secondary | ICD-10-CM

## 2013-08-03 DIAGNOSIS — R109 Unspecified abdominal pain: Secondary | ICD-10-CM | POA: Insufficient documentation

## 2013-08-03 DIAGNOSIS — O99891 Other specified diseases and conditions complicating pregnancy: Secondary | ICD-10-CM | POA: Insufficient documentation

## 2013-08-03 DIAGNOSIS — M549 Dorsalgia, unspecified: Secondary | ICD-10-CM | POA: Insufficient documentation

## 2013-08-03 LAB — CBC
HCT: 37.4 % (ref 36.0–46.0)
MCH: 30.5 pg (ref 26.0–34.0)
MCHC: 33.2 g/dL (ref 30.0–36.0)
RDW: 15.4 % (ref 11.5–15.5)

## 2013-08-03 LAB — POCT FERN TEST: POCT Fern Test: NEGATIVE

## 2013-08-03 LAB — TYPE AND SCREEN: ABO/RH(D): AB POS

## 2013-08-03 MED ORDER — OXYCODONE-ACETAMINOPHEN 5-325 MG PO TABS
2.0000 | ORAL_TABLET | ORAL | Status: DC | PRN
Start: 2013-08-03 — End: 2015-01-24

## 2013-08-03 NOTE — MAU Note (Signed)
Constant menstrual like camps and back pain. On going pain.

## 2013-08-04 MED ORDER — GENTAMICIN SULFATE 40 MG/ML IJ SOLN
INTRAVENOUS | Status: DC
  Filled 2013-08-04: qty 13.25

## 2013-08-05 ENCOUNTER — Encounter (HOSPITAL_COMMUNITY): Payer: Self-pay | Admitting: Certified Registered"

## 2013-08-05 ENCOUNTER — Inpatient Hospital Stay (HOSPITAL_COMMUNITY)
Admission: AD | Admit: 2013-08-05 | Discharge: 2013-08-08 | DRG: 766 | Disposition: A | Discharge status: Home / Self Care | Payer: No Typology Code available for payment source | Source: Ambulatory Visit | Attending: Obstetrics and Gynecology | Admitting: Obstetrics and Gynecology

## 2013-08-05 ENCOUNTER — Inpatient Hospital Stay (HOSPITAL_COMMUNITY): Payer: No Typology Code available for payment source | Admitting: Anesthesiology

## 2013-08-05 ENCOUNTER — Inpatient Hospital Stay (HOSPITAL_COMMUNITY)
Admission: AD | Admit: 2013-08-05 | Payer: No Typology Code available for payment source | Source: Ambulatory Visit | Admitting: Obstetrics and Gynecology

## 2013-08-05 ENCOUNTER — Encounter (HOSPITAL_COMMUNITY)
Admission: AD | Disposition: A | Discharge status: Home / Self Care | Payer: Self-pay | Source: Ambulatory Visit | Attending: Obstetrics and Gynecology

## 2013-08-05 ENCOUNTER — Encounter (HOSPITAL_COMMUNITY): Payer: Self-pay | Admitting: *Deleted

## 2013-08-05 ENCOUNTER — Encounter (HOSPITAL_COMMUNITY): Payer: No Typology Code available for payment source | Admitting: Anesthesiology

## 2013-08-05 DIAGNOSIS — Z22330 Carrier of Group B streptococcus: Secondary | ICD-10-CM

## 2013-08-05 DIAGNOSIS — O34219 Maternal care for unspecified type scar from previous cesarean delivery: Principal | ICD-10-CM | POA: Diagnosis present

## 2013-08-05 DIAGNOSIS — O09529 Supervision of elderly multigravida, unspecified trimester: Secondary | ICD-10-CM | POA: Diagnosis present

## 2013-08-05 DIAGNOSIS — O99892 Other specified diseases and conditions complicating childbirth: Secondary | ICD-10-CM | POA: Diagnosis present

## 2013-08-05 DIAGNOSIS — Z98891 History of uterine scar from previous surgery: Secondary | ICD-10-CM

## 2013-08-05 DIAGNOSIS — Z302 Encounter for sterilization: Secondary | ICD-10-CM

## 2013-08-05 LAB — POCT FERN TEST: POCT Fern Test: POSITIVE

## 2013-08-05 SURGERY — Surgical Case
Anesthesia: Regional

## 2013-08-05 SURGERY — Surgical Case
Anesthesia: Spinal | Site: Abdomen | Laterality: Bilateral | Wound class: Clean Contaminated

## 2013-08-05 MED ORDER — KETOROLAC TROMETHAMINE 60 MG/2ML IM SOLN
60.0000 mg | Freq: Once | INTRAMUSCULAR | Status: AC | PRN
  Administered 2013-08-05: 60 mg via INTRAMUSCULAR

## 2013-08-05 MED ORDER — ONDANSETRON HCL 4 MG/2ML IJ SOLN: 4.0000 mg | Freq: Three times a day (TID) | INTRAMUSCULAR | Status: DC | PRN

## 2013-08-05 MED ORDER — FERROUS SULFATE 325 (65 FE) MG PO TABS
325.0000 mg | ORAL_TABLET | Freq: Two times a day (BID) | ORAL | Status: DC
  Administered 2013-08-06 – 2013-08-08 (×5): 325 mg via ORAL
  Filled 2013-08-05 (×5): qty 1

## 2013-08-05 MED ORDER — NALBUPHINE SYRINGE 5 MG/0.5 ML
INJECTION | INTRAMUSCULAR | Status: AC
  Administered 2013-08-05: 10 mg via SUBCUTANEOUS
  Filled 2013-08-05: qty 0.5

## 2013-08-05 MED ORDER — LORATADINE 10 MG PO TABS
10.0000 mg | ORAL_TABLET | Freq: Every day | ORAL | Status: DC
  Administered 2013-08-06 – 2013-08-07 (×3): 10 mg via ORAL
  Filled 2013-08-05 (×6): qty 1

## 2013-08-05 MED ORDER — MENTHOL 3 MG MT LOZG: 1.0000 | LOZENGE | OROMUCOSAL | Status: DC | PRN

## 2013-08-05 MED ORDER — SIMETHICONE 80 MG PO CHEW
80.0000 mg | CHEWABLE_TABLET | ORAL | Status: DC
  Administered 2013-08-06 – 2013-08-08 (×3): 80 mg via ORAL
  Filled 2013-08-05 (×3): qty 1

## 2013-08-05 MED ORDER — SCOPOLAMINE 1 MG/3DAYS TD PT72
MEDICATED_PATCH | TRANSDERMAL | Status: AC
  Administered 2013-08-05: 1.5 mg via TRANSDERMAL
  Filled 2013-08-05: qty 1

## 2013-08-05 MED ORDER — GENTAMICIN SULFATE 40 MG/ML IJ SOLN: INTRAVENOUS | Status: DC | PRN

## 2013-08-05 MED ORDER — SCOPOLAMINE 1 MG/3DAYS TD PT72
1.0000 | MEDICATED_PATCH | Freq: Once | TRANSDERMAL | Status: AC
  Administered 2013-08-05: 1.5 mg via TRANSDERMAL

## 2013-08-05 MED ORDER — LACTATED RINGERS IV SOLN
INTRAVENOUS | Status: DC | PRN
  Administered 2013-08-05 (×2): via INTRAVENOUS

## 2013-08-05 MED ORDER — FENTANYL CITRATE 0.05 MG/ML IJ SOLN
INTRAMUSCULAR | Status: AC
  Administered 2013-08-05: 50 ug via INTRAVENOUS
  Filled 2013-08-05: qty 2

## 2013-08-05 MED ORDER — ONDANSETRON HCL 4 MG PO TABS: 4.0000 mg | ORAL_TABLET | ORAL | Status: DC | PRN

## 2013-08-05 MED ORDER — DIPHENHYDRAMINE HCL 50 MG/ML IJ SOLN: 25.0000 mg | INTRAMUSCULAR | Status: DC | PRN

## 2013-08-05 MED ORDER — PHENYLEPHRINE HCL 10 MG/ML IJ SOLN
20.0000 mg | INTRAVENOUS | Status: DC | PRN
  Administered 2013-08-05: 60 ug/min via INTRAVENOUS

## 2013-08-05 MED ORDER — PRENATAL MULTIVITAMIN CH
1.0000 | ORAL_TABLET | Freq: Every day | ORAL | Status: DC
  Administered 2013-08-06 – 2013-08-08 (×3): 1 via ORAL
  Filled 2013-08-05 (×3): qty 1

## 2013-08-05 MED ORDER — LANOLIN HYDROUS EX OINT: 1.0000 "application " | TOPICAL_OINTMENT | CUTANEOUS | Status: DC | PRN

## 2013-08-05 MED ORDER — NON FORMULARY: 20.0000 mg | Freq: Every day | Status: DC

## 2013-08-05 MED ORDER — KETOROLAC TROMETHAMINE 30 MG/ML IJ SOLN: 30.0000 mg | Freq: Four times a day (QID) | INTRAMUSCULAR | Status: AC | PRN

## 2013-08-05 MED ORDER — OXYCODONE-ACETAMINOPHEN 5-325 MG PO TABS
1.0000 | ORAL_TABLET | ORAL | Status: DC | PRN
  Administered 2013-08-06 (×4): 2 via ORAL
  Filled 2013-08-05 (×4): qty 2

## 2013-08-05 MED ORDER — LACTATED RINGERS IV SOLN
INTRAVENOUS | Status: DC | PRN
  Administered 2013-08-05 (×2): via INTRAVENOUS

## 2013-08-05 MED ORDER — BUPIVACAINE IN DEXTROSE 0.75-8.25 % IT SOLN
INTRATHECAL | Status: DC | PRN
  Administered 2013-08-05: 1.5 mL via INTRATHECAL

## 2013-08-05 MED ORDER — NALOXONE HCL 1 MG/ML IJ SOLN
1.0000 ug/kg/h | INTRAVENOUS | Status: DC | PRN
  Filled 2013-08-05: qty 2

## 2013-08-05 MED ORDER — ZOLPIDEM TARTRATE 5 MG PO TABS: 5.0000 mg | ORAL_TABLET | Freq: Every evening | ORAL | Status: DC | PRN

## 2013-08-05 MED ORDER — NALBUPHINE SYRINGE 5 MG/0.5 ML
INJECTION | INTRAMUSCULAR | Status: AC
  Filled 2013-08-05: qty 0.5

## 2013-08-05 MED ORDER — WITCH HAZEL-GLYCERIN EX PADS: 1.0000 "application " | MEDICATED_PAD | CUTANEOUS | Status: DC | PRN

## 2013-08-05 MED ORDER — OXYTOCIN 10 UNIT/ML IJ SOLN
40.0000 [IU] | INTRAVENOUS | Status: DC | PRN
  Administered 2013-08-05: 40 [IU] via INTRAVENOUS

## 2013-08-05 MED ORDER — FAMOTIDINE 20 MG PO TABS
20.0000 mg | ORAL_TABLET | Freq: Once | ORAL | Status: AC
  Administered 2013-08-06: 20 mg via ORAL
  Filled 2013-08-05: qty 1

## 2013-08-05 MED ORDER — BISACODYL 10 MG RE SUPP: 10.0000 mg | Freq: Every day | RECTAL | Status: DC | PRN

## 2013-08-05 MED ORDER — SIMETHICONE 80 MG PO CHEW: 80.0000 mg | CHEWABLE_TABLET | ORAL | Status: DC | PRN

## 2013-08-05 MED ORDER — SENNOSIDES-DOCUSATE SODIUM 8.6-50 MG PO TABS
2.0000 | ORAL_TABLET | ORAL | Status: DC
  Administered 2013-08-06 – 2013-08-08 (×3): 2 via ORAL
  Filled 2013-08-05 (×3): qty 2

## 2013-08-05 MED ORDER — FLEET ENEMA 7-19 GM/118ML RE ENEM: 1.0000 | ENEMA | Freq: Every day | RECTAL | Status: DC | PRN

## 2013-08-05 MED ORDER — DIPHENHYDRAMINE HCL 25 MG PO CAPS: 25.0000 mg | ORAL_CAPSULE | Freq: Four times a day (QID) | ORAL | Status: DC | PRN

## 2013-08-05 MED ORDER — FENTANYL CITRATE 0.05 MG/ML IJ SOLN
INTRAMUSCULAR | Status: DC | PRN
  Administered 2013-08-05: 15 ug via INTRATHECAL

## 2013-08-05 MED ORDER — PHENYLEPHRINE HCL 10 MG/ML IJ SOLN: 20.0000 mg | INTRAVENOUS | Status: DC | PRN

## 2013-08-05 MED ORDER — SIMETHICONE 80 MG PO CHEW
80.0000 mg | CHEWABLE_TABLET | Freq: Three times a day (TID) | ORAL | Status: DC
  Administered 2013-08-06 – 2013-08-08 (×7): 80 mg via ORAL
  Filled 2013-08-05 (×7): qty 1

## 2013-08-05 MED ORDER — MEPERIDINE HCL 25 MG/ML IJ SOLN: 6.2500 mg | INTRAMUSCULAR | Status: DC | PRN

## 2013-08-05 MED ORDER — LACTATED RINGERS IV SOLN: INTRAVENOUS | Status: DC

## 2013-08-05 MED ORDER — KETOROLAC TROMETHAMINE 60 MG/2ML IM SOLN
INTRAMUSCULAR | Status: AC
  Administered 2013-08-05: 60 mg via INTRAMUSCULAR
  Filled 2013-08-05: qty 2

## 2013-08-05 MED ORDER — NALOXONE HCL 0.4 MG/ML IJ SOLN: 0.4000 mg | INTRAMUSCULAR | Status: DC | PRN

## 2013-08-05 MED ORDER — DIPHENHYDRAMINE HCL 50 MG/ML IJ SOLN: 12.5000 mg | INTRAMUSCULAR | Status: DC | PRN

## 2013-08-05 MED ORDER — OXYTOCIN 40 UNITS IN LACTATED RINGERS INFUSION - SIMPLE MED: 62.5000 mL/h | INTRAVENOUS | Status: AC

## 2013-08-05 MED ORDER — ONDANSETRON HCL 4 MG/2ML IJ SOLN
INTRAMUSCULAR | Status: DC | PRN
  Administered 2013-08-05: 4 mg via INTRAMUSCULAR

## 2013-08-05 MED ORDER — FENTANYL CITRATE 0.05 MG/ML IJ SOLN
25.0000 ug | INTRAMUSCULAR | Status: DC | PRN
  Administered 2013-08-05: 50 ug via INTRAVENOUS

## 2013-08-05 MED ORDER — NALBUPHINE HCL 10 MG/ML IJ SOLN
5.0000 mg | INTRAMUSCULAR | Status: DC | PRN
  Filled 2013-08-05: qty 1

## 2013-08-05 MED ORDER — DIBUCAINE 1 % RE OINT: 1.0000 "application " | TOPICAL_OINTMENT | RECTAL | Status: DC | PRN

## 2013-08-05 MED ORDER — SODIUM CHLORIDE 0.9 % IJ SOLN: 3.0000 mL | INTRAMUSCULAR | Status: DC | PRN

## 2013-08-05 MED ORDER — IBUPROFEN 600 MG PO TABS
600.0000 mg | ORAL_TABLET | Freq: Four times a day (QID) | ORAL | Status: DC
  Administered 2013-08-06 – 2013-08-08 (×11): 600 mg via ORAL
  Filled 2013-08-05 (×11): qty 1

## 2013-08-05 MED ORDER — GENTAMICIN SULFATE 40 MG/ML IJ SOLN
INTRAVENOUS | Status: DC | PRN
  Administered 2013-08-05: 100 mL via INTRAVENOUS

## 2013-08-05 MED ORDER — ONDANSETRON HCL 4 MG/2ML IJ SOLN: 4.0000 mg | INTRAMUSCULAR | Status: DC | PRN

## 2013-08-05 MED ORDER — DIPHENHYDRAMINE HCL 25 MG PO CAPS: 25.0000 mg | ORAL_CAPSULE | ORAL | Status: DC | PRN

## 2013-08-05 MED ORDER — INFLUENZA VAC SPLIT QUAD 0.5 ML IM SUSP
0.5000 mL | INTRAMUSCULAR | Status: AC
  Administered 2013-08-06: 0.5 mL via INTRAMUSCULAR
  Filled 2013-08-05: qty 0.5

## 2013-08-05 MED ORDER — MEASLES, MUMPS & RUBELLA VAC ~~LOC~~ INJ
0.5000 mL | INJECTION | Freq: Once | SUBCUTANEOUS | Status: DC
  Filled 2013-08-05: qty 0.5

## 2013-08-05 MED ORDER — NALBUPHINE HCL 10 MG/ML IJ SOLN
5.0000 mg | INTRAMUSCULAR | Status: DC | PRN
  Administered 2013-08-05: 10 mg via SUBCUTANEOUS
  Filled 2013-08-05: qty 1

## 2013-08-05 MED ORDER — MORPHINE SULFATE (PF) 0.5 MG/ML IJ SOLN
INTRAMUSCULAR | Status: DC | PRN
  Administered 2013-08-05: .1 mg via INTRATHECAL

## 2013-08-05 MED ORDER — TETANUS-DIPHTH-ACELL PERTUSSIS 5-2.5-18.5 LF-MCG/0.5 IM SUSP
0.5000 mL | Freq: Once | INTRAMUSCULAR | Status: AC
Start: 2013-08-06 — End: 2013-08-06
  Administered 2013-08-06: 0.5 mL via INTRAMUSCULAR
  Filled 2013-08-05: qty 0.5

## 2013-08-05 MED ORDER — METOCLOPRAMIDE HCL 5 MG/ML IJ SOLN: 10.0000 mg | Freq: Three times a day (TID) | INTRAMUSCULAR | Status: DC | PRN

## 2013-08-05 SURGICAL SUPPLY — 37 items
BENZOIN TINCTURE PRP APPL 2/3 (GAUZE/BANDAGES/DRESSINGS) ×2 IMPLANT
CLAMP CORD UMBIL (MISCELLANEOUS) IMPLANT
CLOTH BEACON ORANGE TIMEOUT ST (SAFETY) ×2 IMPLANT
CONTAINER PREFILL 10% NBF 15ML (MISCELLANEOUS) IMPLANT
DRAIN JACKSON PRT FLT 10 (DRAIN) IMPLANT
DRAPE LG THREE QUARTER DISP (DRAPES) ×4 IMPLANT
DRSG OPSITE POSTOP 4X10 (GAUZE/BANDAGES/DRESSINGS) ×2 IMPLANT
DURAPREP 26ML APPLICATOR (WOUND CARE) ×2 IMPLANT
ELECT REM PT RETURN 9FT ADLT (ELECTROSURGICAL) ×2
ELECTRODE REM PT RTRN 9FT ADLT (ELECTROSURGICAL) ×1 IMPLANT
EVACUATOR SILICONE 100CC (DRAIN) IMPLANT
EXTRACTOR VACUUM M CUP 4 TUBE (SUCTIONS) IMPLANT
GLOVE BIO SURGEON STRL SZ 6.5 (GLOVE) ×2 IMPLANT
GLOVE BIOGEL PI IND STRL 7.0 (GLOVE) ×1 IMPLANT
GLOVE BIOGEL PI INDICATOR 7.0 (GLOVE) ×1
GOWN PREVENTION PLUS XLARGE (GOWN DISPOSABLE) ×2 IMPLANT
GOWN STRL NON-REIN LRG LVL3 (GOWN DISPOSABLE) ×2 IMPLANT
GOWN STRL REIN XL XLG (GOWN DISPOSABLE) ×2 IMPLANT
KIT ABG SYR 3ML LUER SLIP (SYRINGE) IMPLANT
NEEDLE HYPO 25X5/8 SAFETYGLIDE (NEEDLE) IMPLANT
NS IRRIG 1000ML POUR BTL (IV SOLUTION) ×2 IMPLANT
PACK C SECTION WH (CUSTOM PROCEDURE TRAY) ×2 IMPLANT
PAD OB MATERNITY 4.3X12.25 (PERSONAL CARE ITEMS) ×2 IMPLANT
RTRCTR C-SECT PINK 25CM LRG (MISCELLANEOUS) IMPLANT
STAPLER VISISTAT 35W (STAPLE) IMPLANT
STRIP CLOSURE SKIN 1/2X4 (GAUZE/BANDAGES/DRESSINGS) ×2 IMPLANT
SUT CHROMIC 0 CT 1 (SUTURE) ×2 IMPLANT
SUT MNCRL AB 3-0 PS2 27 (SUTURE) IMPLANT
SUT PLAIN 2 0 (SUTURE) ×2
SUT PLAIN 2 0 XLH (SUTURE) ×2 IMPLANT
SUT PLAIN ABS 2-0 CT1 27XMFL (SUTURE) ×2 IMPLANT
SUT SILK 2 0 SH (SUTURE) IMPLANT
SUT VIC AB 0 CTX 36 (SUTURE) ×4
SUT VIC AB 0 CTX36XBRD ANBCTRL (SUTURE) ×4 IMPLANT
TOWEL OR 17X24 6PK STRL BLUE (TOWEL DISPOSABLE) ×2 IMPLANT
TRAY FOLEY CATH 14FR (SET/KITS/TRAYS/PACK) ×2 IMPLANT
WATER STERILE IRR 1000ML POUR (IV SOLUTION) ×2 IMPLANT

## 2013-08-05 NOTE — Transfer of Care (Signed)
Immediate Anesthesia Transfer of Care Note  Patient: Heather Odonnell  Procedure(s) Performed: Procedure(s): REPEAT CESAREAN SECTION WITH BILATERAL TUBAL LIGATION (Bilateral)  Patient Location: PACU  Anesthesia Type:Spinal  Level of Consciousness: awake, alert , oriented and patient cooperative  Airway & Oxygen Therapy: Patient Spontanous Breathing  Post-op Assessment: Report given to PACU RN and Post -op Vital signs reviewed and stable  Post vital signs: Reviewed and stable  Complications: No apparent anesthesia complications

## 2013-08-05 NOTE — Anesthesia Procedure Notes (Signed)
Spinal  Patient location during procedure: OR Staffing Performed by: anesthesiologist  Preanesthetic Checklist Completed: patient identified, site marked, surgical consent, pre-op evaluation, timeout performed, IV checked, risks and benefits discussed and monitors and equipment checked Spinal Block Patient position: sitting Prep: DuraPrep Patient monitoring: heart rate, cardiac monitor, continuous pulse ox and blood pressure Approach: midline Location: L3-4 Injection technique: single-shot Needle Needle type: Pencan  Needle gauge: 24 G Needle length: 9 cm Assessment Sensory level: T4 Additional Notes Clear free flow CSF on first attempt.  No paresthesia  Patient tolerated procedure well with no apparent complications. Jasmine December, MD

## 2013-08-05 NOTE — MAU Note (Addendum)
Pt reports she lost her mucus plug and was told to come in . C-Section scheduled for 2:30 this afternoon. Pt thinks her water broke when she got to the hospital. Clear fluid noted. C/O mild cramping and good fetal movement reported

## 2013-08-05 NOTE — Lactation Note (Signed)
This note was copied from the chart of Girl Eliah Ingalls. Lactation Consultation Note  Patient Name: Girl Mandi Mattioli [id number] Date: 08/05/2013 Reason for consult: Initial assessment of this second-time breastfeeding mom and her newborn at 5 hours of age. Mom has an older baby just 16 months old whom she breastfed.  At time of visit, mom is being assessed and cared for by her RN.  Baby has fed several times with a LATCH score of 7 at one feeding.  LC encouraged STS and cue feedings and  LC provided [facility] Resource brochure and reviewed [facility] services and list of community and web site resources.    Maternal Data Formula Feeding for Exclusion: No Infant to breast within first hour of birth: No Breastfeeding delayed due to:: Other (comment) (reason not documented; baby 72 minutes of age at first attempt) Has patient been taught Hand Expression?: Yes Does the patient have breastfeeding experience prior to this delivery?: Yes  Feeding Feeding Type: Breast Fed Length of feed: 0 min  LATCH Score/Interventions            Initial LATCH score=7 per RN assessment          Lactation Tools Discussed/Used   STS and cue feedings  Consult Status Consult Status: Follow-up Date: 08/06/13 Follow-up type: In-patient    Warrick Parisian [facility] 08/05/2013, 7:40 PM

## 2013-08-05 NOTE — MAU Note (Signed)
States water broke. For scheduled C/S at 1430.

## 2013-08-05 NOTE — H&P (Addendum)
Heather Odonnell is a 37 y.o. female presenting for repeat CS. She started contracting at 9 am and had SROM with clear fluid at 11 am History OB History   Grav Para Term Preterm Abortions TAB SAB Ect Mult Living   2 1 1       1      Past Medical History  Diagnosis Date  . Difficulty waking 2011    difficulty waking up  . GERD (gastroesophageal reflux disease)   . Complication of anesthesia     trouble waking up at last surgery  . Fibroid 2001  . H/O varicella   . H/O infertility     IVF 2012  . Irritable bowel syndrome   . Hiatal hernia   . Neck injury 2011   Past Surgical History  Procedure Laterality Date  . Myomectomy abdominal approach  2001  . Tonsillectomy    . Neck surgery  2011  . Back surgery  2002  . Cesarean section  03/14/2012    Procedure: CESAREAN SECTION;  Surgeon: Michael Litter, MD;  Location: [facility];  Service: Gynecology;  Laterality: N/A;  . Wisdom tooth extraction  1997  . Back surgery  2000   Family History: family history includes Cancer in her paternal grandmother; Diabetes in her sister; Fibromyalgia in her sister; Other in her father and mother; Parkinson's disease in her maternal grandfather; Seizures in her cousin; Stroke in her paternal grandfather and paternal grandmother. Social History:  reports that she has quit smoking. She has never used smokeless tobacco. She reports that she does not drink alcohol or use illicit drugs.   Prenatal Transfer Tool  Maternal Diabetes: No Genetic Screening: Normal Maternal Ultrasounds/Referrals: Normal Fetal Ultrasounds or other Referrals:  None Maternal Substance Abuse:  No Significant Maternal Medications:  None Significant Maternal Lab Results:  None Other Comments:  None  ROS    Last menstrual period 11/05/2012. Exam Physical Exam  LMP 11/05/2012 AVSS NST category 1 contractions q 3 minutes Physical Examination: General appearance - alert, well appearing, and in no distress Chest - clear to  auscultation, no wheezes, rales or rhonchi, symmetric air entry Heart - normal rate and regular rhythm, S1 and S2 normal Abdomen - soft, nontender, nondistended, no masses or organomegaly gavid Pelvic - 1/70/-2 in office exam. Pt is grossly ruptured Extremities - Homan's sign negative bilaterally Prenatal labs: ABO, Rh: --/--/AB POS, AB POS (10/13 1645) Antibody: NEG (10/13 1645) Rubella: 2.61 (02/11 1526) RPR: NON REACTIVE (10/13 1645)  HBsAg: NEGATIVE (02/11 1526)  HIV: NON REACTIVE (02/11 1526)  GBS:     Assessment/Plan: SROM at 39 weeks for repeat CS For repeat CS with B salpingectomy or BTl R&B reviewed with the pt GBS positive     Trevar Boehringer A 08/05/2013, 12:40 PM  Date of Initial H&P: today  History reviewed, patient examined, no change in status, stable for surgery.

## 2013-08-05 NOTE — Anesthesia Postprocedure Evaluation (Signed)
  Anesthesia Post-op Note  Anesthesia Post Note  Patient: Heather Odonnell  Procedure(s) Performed: Procedure(s) (LRB): REPEAT CESAREAN SECTION WITH BILATERAL Salpingectomy (Bilateral)  Anesthesia type: Spinal  Patient location: PACU  Post pain: Pain level controlled  Post assessment: Post-op Vital signs reviewed  Last Vitals:  Filed Vitals:   08/05/13 1515  BP: 122/71  Pulse: 106  Temp:   Resp: 24    Post vital signs: Reviewed  Level of consciousness: awake  Complications: No apparent anesthesia complications

## 2013-08-05 NOTE — Op Note (Signed)
Cesarean Section Procedure Note   Heather Odonnell  08/05/2013  Indications: Scheduled Proceedure/Maternal Request and Labor and SROM   Pre-operative Diagnosis: Prior Cesarean Section.   Post-operative Diagnosis: Same   Surgeon Sophronia Varney  Procedure Repeat CS with Bilateral salpingectomy  Assistants: none  Anesthesia: spinal   Procedure Details:  The patient was seen in the Holding Room. The risks, benefits, complications, treatment options, and expected outcomes were discussed with the patient. The patient concurred with the proposed plan, giving informed consent. identified as Heather Odonnell and the procedure verified as C-Section Delivery. A Time Out was held and the above information confirmed.  After induction of anesthesia, the patient was draped and prepped in the usual sterile manner. A transverse incision was made and carried down through the subcutaneous tissue to the fascia. Fascial incision was made and extended transversely. The fascia was separated from the underlying rectus tissue superiorly and inferiorly. The peritoneum was identified and entered. Peritoneal incision was extended longitudinally. The utero-vesical peritoneal reflection was incised transversely and the bladder flap was bluntly freed from the lower uterine segment. A low transverse uterine incision was made. Delivered from cephalic presentation was a  pound Living newborn infant(s) with Apgar scores of 9 at one minute and 9 at five minutes. Cord ph was not sent the umbilical cord was clamped and cut cord blood was obtained for evaluation. The placenta was removed Intact and appeared normal. The uterine outline, tubes and ovaries appeared normal}. The uterine incision was closed with running locked sutures of 0Vicryl. A second layer of 0 vicryl was used to imbricate the uterus. .  Both portions of tubes was sent to pathology.     Hemostasis was observed. Lavage was carried out until clear. The fascia was  then reapproximated w The patients the left fallopian tube was grasped with a babcock.  The mesosalpinx was clamped with kelly clamps.  The tube was removed using metzenbaum scissors and the mesosalpinx was ligated with 3-0 plain.  Hemostasis was assured .  The fascia was closed w ith running sutures of 0Vicryl. The subcuticular closure was performed using 2-0plain gut. The skin was closed with 3-8monocryl.   Instrument, sponge, and needle counts were correct prior the abdominal closure and were correct at the conclusion of the case.    Findings: female infant in vtx presentation.  Nuchal  Cord times one.  Clear fluid.    Estimated Blood Loss:  Total IV Fluids:   Urine Output: 350CC OF clear urine  Specimens: placenta and bilateral tubes  Complications: no complications  Disposition: PACU - hemodynamically stable.   Maternal Condition: stable   Baby condition / location:  nursery-stable  Attending Attestation: I was present and scrubbed for the entire procedure.   Signed: Surgeon(s): Michael Litter, MD

## 2013-08-05 NOTE — Anesthesia Preprocedure Evaluation (Addendum)
Anesthesia Evaluation  Patient identified by MRN, date of birth, ID band Patient awake    Reviewed: Allergy & Precautions, H&P , NPO status , Patient's Chart, lab work & pertinent test results, reviewed documented beta blocker date and time   History of Anesthesia Complications (+) PROLONGED EMERGENCE and history of anesthetic complications  Airway Mallampati: II TM Distance: >3 FB Neck ROM: full    Dental  (+) Teeth Intact   Pulmonary neg pulmonary ROS,  breath sounds clear to auscultation        Cardiovascular negative cardio ROS  Rhythm:regular Rate:Normal     Neuro/Psych S/p neck and back surgeries negative psych ROS   GI/Hepatic Neg liver ROS, hiatal hernia, GERD-  Medicated,  Endo/Other  Morbid obesity  Renal/GU negative Renal ROS  negative genitourinary   Musculoskeletal   Abdominal   Peds  Hematology negative hematology ROS (+)   Anesthesia Other Findings   Reproductive/Obstetrics (+) Pregnancy (h/o c/s x1, h/o abdominal myomectomy)                          Anesthesia Physical Anesthesia Plan  ASA: II and emergent  Anesthesia Plan: Spinal   Post-op Pain Management:    Induction:   Airway Management Planned:   Additional Equipment:   Intra-op Plan:   Post-operative Plan:   Informed Consent: I have reviewed the patients History and Physical, chart, labs and discussed the procedure including the risks, benefits and alternatives for the proposed anesthesia with the patient or authorized representative who has indicated his/her understanding and acceptance.     Plan Discussed with: Surgeon and CRNA  Anesthesia Plan Comments:        Anesthesia Quick Evaluation

## 2013-08-05 NOTE — Consult Note (Signed)
Delivery Attendance Note  Asked by OB to attend repeat c-section of term infant. Infant with vigorous cry at delivery. Placed on radiant warmer bed at ~1.5 min of life. Dried and stimulated. Infant remained vigorous and required no further intervention. Voided and stooled. Normal assessment without gross abnormalities, 3 vessel cord, palate intact, comfortable WOB. Left with NBN RN for skin to skin care and transferred care to Pediatrician.   Pregnancy uncomplicated, GBS positive with other serologies negative.  Enid Baas, NNP-BC

## 2013-08-06 ENCOUNTER — Encounter (HOSPITAL_COMMUNITY): Payer: Self-pay | Admitting: *Deleted

## 2013-08-06 LAB — CBC
HCT: 34.1 % — ABNORMAL LOW (ref 36.0–46.0)
MCH: 29.9 pg (ref 26.0–34.0)
MCV: 91.9 fL (ref 78.0–100.0)
RBC: 3.71 MIL/uL — ABNORMAL LOW (ref 3.87–5.11)
WBC: 11.1 10*3/uL — ABNORMAL HIGH (ref 4.0–10.5)

## 2013-08-06 MED ORDER — FAMOTIDINE 20 MG PO TABS
20.0000 mg | ORAL_TABLET | Freq: Every day | ORAL | Status: DC
  Administered 2013-08-06 – 2013-08-07 (×2): 20 mg via ORAL
  Filled 2013-08-06 (×2): qty 1

## 2013-08-06 NOTE — Progress Notes (Signed)
Post Partum Day 1 Subjective: no complaints, up ad lib, voiding, tolerating PO and + flatus.  Requesting pain med.  Objective: Blood pressure 141/73, pulse 90, temperature 98 F (36.7 C), temperature source Oral, resp. rate 20, weight 107.5 kg (236 lb 15.9 oz), last menstrual period 11/05/2012, SpO2 99.00%, unknown if currently breastfeeding.  BPs 120s-140s/70s-80s  Physical Exam:  General: alert and no distress Lochia: appropriate Uterine Fundus: firm Incision: healing well, dressing intact DVT Evaluation: No evidence of DVT seen on physical exam.   Recent Labs  08/06/13 0655  HGB 11.1*  HCT 34.1*    Assessment/Plan: Plan for discharge tomorrow, Breastfeeding and Contraception s/p bilateral salpingectomy   LOS: 1 day   Sharion Grieves Y 08/06/2013, 5:16 PM

## 2013-08-06 NOTE — Anesthesia Postprocedure Evaluation (Signed)
  Anesthesia Post-op Note  Patient: Heather Odonnell  Procedure(s) Performed: * No procedures listed *  Patient Location: Mother/Baby  Anesthesia Type:Spinal  Level of Consciousness: awake, alert  and oriented  Airway and Oxygen Therapy: Patient Spontanous Breathing  Post-op Pain: none  Post-op Assessment: Post-op Vital signs reviewed, Patient's Cardiovascular Status Stable, No headache, No backache, No residual numbness and No residual motor weakness  Post-op Vital Signs: Reviewed and stable  Complications: No apparent anesthesia complications

## 2013-08-07 DIAGNOSIS — Z98891 History of uterine scar from previous surgery: Secondary | ICD-10-CM

## 2013-08-07 NOTE — Progress Notes (Signed)
Subjective: Postpartum Day 2: Cesarean Delivery due to planned repeat, presented with SROM and labor. Patient up ad lib, reports no syncope or dizziness. Feeding:  Breast Contraceptive plan:  Had BTL with C/S. Declines d/c today--planning tomorrow.  Objective: Vital signs in last 24 hours: Temp:  [98 F (36.7 C)-99.4 F (37.4 C)] 99.4 F (37.4 C) (10/17 0545) Pulse Rate:  [84-90] 84 (10/17 0545) Resp:  [18-20] 18 (10/17 0545) BP: (121-143)/(73-90) 143/90 mmHg (10/17 0545) SpO2:  [99 %] 99 % (10/16 1330)  Physical Exam:  General: alert Lochia: appropriate Uterine Fundus: firm Incision: Dressing CDI DVT Evaluation: No evidence of DVT seen on physical exam. Negative Homan's sign. JP drain:   NA   Recent Labs  08/06/13 0655  HGB 11.1*  HCT 34.1*  Hgb 12.4 pre-op  Assessment/Plan: Status post Cesarean section day 2. Doing well postoperatively.  Continue current care. Plan for discharge tomorrow    Nigel Bridgeman 08/07/2013, 12:55 PM

## 2013-08-07 NOTE — Progress Notes (Signed)
UR chart review completed.  

## 2013-08-08 MED ORDER — IBUPROFEN 600 MG PO TABS: 600.0000 mg | ORAL_TABLET | Freq: Four times a day (QID) | ORAL | Status: AC

## 2013-08-08 NOTE — Discharge Summary (Signed)
Obstetric Discharge Summary Reason for Admission: cesarean section and rupture of membranes Prenatal Procedures: ultrasound Intrapartum Procedures: cesarean: low cervical, transverse and tubal ligation Postpartum Procedures: none Complications-Operative and Postpartum: none Hemoglobin  Date Value Range Status  08/06/2013 11.1* 12.0 - 15.0 g/dL Final     HCT  Date Value Range Status  08/06/2013 34.1* 36.0 - 46.0 % Final   Pt admitted due to SROM at 39w 0d and repeat LTCS with BTL.  Pt underwent LTCS and BTL without complications.  Pt remained afebrile with stable vital signs throughout postoperative course.  On postoperative day 3, pt ambulating, voiding and tol po liquids and solids without difficulty.    Physical Exam:  General: alert, cooperative and no distress  Heart: RRR  Lungs: CTA bilat  Abd: Soft, nontender with pos BS x 4 quads  Lochia: appropriate, sm rubra  Uterine Fundus: firm, nontender 1 below umb  Incision: Intact without obvious redness. Occlusive dsg and steristrips noted to be intact  DVT Evaluation: No evidence of DVT seen on physical exam.  Negative Homan's sign bilat.  No significant calf/ankle edema.  Discharge Diagnoses: Term Pregnancy-delivered                                         S/P repeat LTCS and bilateral BTL.  Discharge Information: Date: 08/08/2013 Activity:  See practice brochure Diet: routine Medications: PNV, Ibuprofen and Percocet Condition: stable Instructions: refer to practice specific booklet Discharge to: home Follow-up Information   Follow up with [facility]. Schedule an appointment as soon as possible for a visit in 6 weeks.   Contact information:   [address], [address] [place] [address]       Newborn Data: Live born female  Birth Weight: 8 lb 15.6 oz (4070 g) APGAR: 9, 9  Home with mother.  Anees Vanecek O. 08/08/2013, 11:39 AM

## 2013-08-08 NOTE — Lactation Note (Signed)
This note was copied from the chart of Heather Odonnell. Lactation Consultation Note  Patient Name: Heather Odonnell Date: 08/08/2013 Reason for consult: Follow-up assessment Mom reports baby is nursing well, denies concerns. Mom is experienced breastfeeder. Engorgement care reviewed if needed. Advised of OP services and support group. Mom reports baby is nursing frequently, hearing swallows. Mom feels her milk is starting to come in. Advised to call if she has questions or concerns.   Maternal Data    Feeding Feeding Type: Breast Fed Length of feed: 20 min  LATCH Score/Interventions Latch: Grasps breast easily, tongue down, lips flanged, rhythmical sucking.  Audible Swallowing: A few with stimulation  Type of Nipple: Everted at rest and after stimulation  Comfort (Breast/Nipple): Soft / non-tender     Hold (Positioning): No assistance needed to correctly position infant at breast.  LATCH Score: 9  Lactation Tools Discussed/Used     Consult Status Consult Status: Complete Date: 08/08/13 Follow-up type: In-patient    Alfred Levins 08/08/2013, 10:24 AM

## 2013-08-08 NOTE — Progress Notes (Addendum)
Subjective: Postpartum Day 3: Cesarean Delivery Patient reports feeling well.  Ambulating, voiding and tol po liquids and solids without difficulty.  Pos flatus, pos BM x 2.  Breastfeeding without difficulty.  Reports incisional pain well controlled with meds.    Objective: Vital signs in last 24 hours: Temp:  [98.1 F (36.7 C)] 98.1 F (36.7 C) (10/18 0657) Pulse Rate:  [87] 87 (10/18 0657) Resp:  [20] 20 (10/18 0657) BP: (126)/(84) 126/84 mmHg (10/18 0657)  Physical Exam:  General: alert, cooperative and no distress Heart:  RRR Lungs:  CTA bilat Abd:  Soft, nontender with pos BS x 4 quads Lochia: appropriate, sm rubra Uterine Fundus: firm, nontender 1 below umb Incision: Intact without obvious redness.  Occlusive dsg and steristrips noted to be intact DVT Evaluation: No evidence of DVT seen on physical exam. Negative Homan's sign bilat. No significant calf/ankle edema.   Recent Labs  08/06/13 0655  HGB 11.1*  HCT 34.1*    Assessment/Plan: Status post repeat Cesarean section and BTL at 39.0 wks. Doing well postoperatively.   Discharge home with standard precautions. Return to clinic in 4-6 weeks. Rx Percocet, Motrin.    Heather Odonnell O. 08/08/2013, 11:27 AM

## 2014-08-23 ENCOUNTER — Encounter (HOSPITAL_COMMUNITY): Payer: Self-pay | Admitting: *Deleted

## 2014-12-29 ENCOUNTER — Other Ambulatory Visit: Payer: Self-pay | Admitting: Obstetrics and Gynecology

## 2015-01-04 ENCOUNTER — Telehealth: Payer: Self-pay | Admitting: Internal Medicine

## 2015-01-04 NOTE — Telephone Encounter (Signed)
CALLED AND CONFIRMED APPT WITH PATIENT   DR Julien Nordmann January 24, 2015 @ 11:00AM DX: ABNORMAL TEST RESULTS/ELEVATED PROTEIN C REFERRING:  DR. DILLARD

## 2015-01-05 ENCOUNTER — Telehealth: Payer: Self-pay | Admitting: Internal Medicine

## 2015-01-06 ENCOUNTER — Telehealth: Payer: Self-pay | Admitting: Internal Medicine

## 2015-01-06 NOTE — Telephone Encounter (Signed)
Chart made 01/06/15.

## 2015-01-20 ENCOUNTER — Other Ambulatory Visit: Payer: Self-pay | Admitting: Medical Oncology

## 2015-01-20 DIAGNOSIS — R7982 Elevated C-reactive protein (CRP): Secondary | ICD-10-CM

## 2015-01-24 ENCOUNTER — Ambulatory Visit (HOSPITAL_BASED_OUTPATIENT_CLINIC_OR_DEPARTMENT_OTHER): Payer: No Typology Code available for payment source | Admitting: Internal Medicine

## 2015-01-24 ENCOUNTER — Ambulatory Visit: Payer: No Typology Code available for payment source

## 2015-01-24 ENCOUNTER — Other Ambulatory Visit (HOSPITAL_BASED_OUTPATIENT_CLINIC_OR_DEPARTMENT_OTHER): Payer: No Typology Code available for payment source

## 2015-01-24 ENCOUNTER — Encounter (INDEPENDENT_AMBULATORY_CARE_PROVIDER_SITE_OTHER): Payer: Self-pay

## 2015-01-24 ENCOUNTER — Other Ambulatory Visit: Payer: Self-pay | Admitting: Internal Medicine

## 2015-01-24 ENCOUNTER — Encounter: Payer: Self-pay | Admitting: Internal Medicine

## 2015-01-24 VITALS — BP 119/79 | HR 64 | Temp 98.7°F | Resp 20 | Ht 65.5 in | Wt 196.9 lb

## 2015-01-24 DIAGNOSIS — R7982 Elevated C-reactive protein (CRP): Secondary | ICD-10-CM

## 2015-01-24 DIAGNOSIS — N920 Excessive and frequent menstruation with regular cycle: Secondary | ICD-10-CM | POA: Diagnosis not present

## 2015-01-24 DIAGNOSIS — Z86718 Personal history of other venous thrombosis and embolism: Secondary | ICD-10-CM

## 2015-01-24 DIAGNOSIS — R791 Abnormal coagulation profile: Secondary | ICD-10-CM | POA: Diagnosis not present

## 2015-01-24 DIAGNOSIS — N951 Menopausal and female climacteric states: Secondary | ICD-10-CM | POA: Diagnosis not present

## 2015-01-24 LAB — CBC WITH DIFFERENTIAL/PLATELET
BASO%: 0.7 % (ref 0.0–2.0)
Basophils Absolute: 0.1 10*3/uL (ref 0.0–0.1)
EOS ABS: 0.3 10*3/uL (ref 0.0–0.5)
EOS%: 3.7 % (ref 0.0–7.0)
HCT: 39.1 % (ref 34.8–46.6)
HGB: 12.6 g/dL (ref 11.6–15.9)
LYMPH%: 34.8 % (ref 14.0–49.7)
MCH: 30.4 pg (ref 25.1–34.0)
MCHC: 32.4 g/dL (ref 31.5–36.0)
MCV: 93.8 fL (ref 79.5–101.0)
MONO#: 0.5 10*3/uL (ref 0.1–0.9)
MONO%: 8 % (ref 0.0–14.0)
NEUT%: 52.8 % (ref 38.4–76.8)
NEUTROS ABS: 3.6 10*3/uL (ref 1.5–6.5)
Platelets: 313 10*3/uL (ref 145–400)
RBC: 4.16 10*6/uL (ref 3.70–5.45)
RDW: 12.4 % (ref 11.2–14.5)
WBC: 6.9 10*3/uL (ref 3.9–10.3)
lymph#: 2.4 10*3/uL (ref 0.9–3.3)

## 2015-01-24 LAB — COMPREHENSIVE METABOLIC PANEL (CC13)
ALT: 26 U/L (ref 0–55)
ANION GAP: 8 meq/L (ref 3–11)
AST: 15 U/L (ref 5–34)
Albumin: 3.8 g/dL (ref 3.5–5.0)
Alkaline Phosphatase: 55 U/L (ref 40–150)
BUN: 10.5 mg/dL (ref 7.0–26.0)
CO2: 26 meq/L (ref 22–29)
CREATININE: 0.8 mg/dL (ref 0.6–1.1)
Calcium: 9.4 mg/dL (ref 8.4–10.4)
Chloride: 108 mEq/L (ref 98–109)
EGFR: >90 mL/min/{1.73_m2} (ref >90)
Glucose: 79 mg/dl (ref 70–140)
Potassium: 3.8 mEq/L (ref 3.5–5.1)
Sodium: 142 mEq/L (ref 136–145)
Total Bilirubin: 0.31 mg/dL (ref 0.20–1.20)
Total Protein: 7.3 g/dL (ref 6.4–8.3)

## 2015-01-24 NOTE — Progress Notes (Signed)
Checked in new patient with no issues prior to seeing the dr. She has not traveled and has appt card.

## 2015-01-24 NOTE — Progress Notes (Signed)
Heather Odonnell [phone]   [phone]) [phone]  CONSULT NOTE  REFERRING PHYSICIAN: Dr. Charlesetta Garibaldi  REASON FOR CONSULTATION:  39 years old white female with abnormal hypercoagulable panel.  HPI Heather Odonnell is a 39 y.o. female with past medical history significant for uterine leiomyoma status post surgical resection as well as history of infertility for few years and history of back surgery. The patient has 2 children now the youngest is 38 months old. She had tubal ligation after the second child. She has been complaining of heavy menstruation as well as hot flashes. She was considered by her gynecologist to start treatment with oral contraceptive pills and he started this treatment 3 weeks ago. The patient has a family history of deep venous thrombosis in her father and paternal aunt. Her father has not been tested for hypercoagulable abnormality. The patient was concerned about clotting problem and her family and wanted to make sure she does not have a hypercoagulable abnormality. Her gynecologist ordered a hypercoagulable panel that was unremarkable except for elevated protein C activity. She was referred to me today for evaluation and discussion of this abnormality. The patient is feeling fine today except for chest congestion secondary to recent flu. She also has mild dry cough but no significant shortness of breath or hemoptysis. She continues to have hot flashes. She denied having any significant weight loss or night sweats. She denied having any nausea or vomiting. The patient denied having any bleeding problem. She has no personal history of blood clotting.  Her family history significant for a father with the venous thrombosis and pulmonary embolism. Her paternal aunt also had deep venous thrombosis. Her mother is healthy and she has a sister with diabetes mellitus and fibromyalgia. The patient is married and has 2 children. She was accompanied by her husband  Roderic Palau. 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 She has no history of smoking but drinks alcohol occasionally and no history of drug abuse. HPI  Past Medical History  Diagnosis Date  . Difficulty waking 2011    difficulty waking up  . GERD (gastroesophageal reflux disease)   . Complication of anesthesia     trouble waking up at last surgery  . Fibroid 2001  . H/O varicella   . H/O infertility     IVF 2012  . Irritable bowel syndrome   . Hiatal hernia   . Neck injury 2011    Past Surgical History  Procedure Laterality Date  . Myomectomy abdominal approach  2001  . Tonsillectomy    . Neck surgery  2011  . Back surgery  2002  . Cesarean section  03/14/2012    Procedure: CESAREAN SECTION;  Surgeon: Betsy Coder, MD;  Location: [facility];  Service: Gynecology;  Laterality: N/A;  . Wisdom tooth extraction  1997  . Back surgery  2000  . Cesarean section with bilateral tubal ligation Bilateral 08/05/2013    Procedure: REPEAT CESAREAN SECTION WITH BILATERAL Salpingectomy;  Surgeon: Betsy Coder, MD;  Location: [facility];  Service: Obstetrics;  Laterality: Bilateral;    Family History  Problem Relation Age of Onset  . Diabetes Sister     IDDM  . Cancer Paternal Grandmother     Skin  . Stroke Paternal Grandfather   . Other Mother     Varicose veins  . Other Father     Blood clots  . Seizures Cousin   . Parkinson's disease Maternal Grandfather   . Stroke Paternal Grandmother   .  Fibromyalgia Sister     Social History History  Substance Use Topics  . Smoking status: Former Smoker -- 3 years  . Smokeless tobacco: Never Used  . Alcohol Use: No    Allergies  Allergen Reactions  . Codeine Nausea And Vomiting    Pt reports tolerating percocet.  . Cephalexin Rash    Current Outpatient Prescriptions  Medication Sig Dispense Refill  . CAMRESE 0.15-0.03 &0.01 MG tablet Take 1 tablet by mouth daily.  3  . loratadine (CLARITIN) 10 MG tablet Take 10 mg by mouth at bedtime.      Marland Kitchen omeprazole (PRILOSEC) 20 MG capsule Take 20 mg by mouth at bedtime.    Marland Kitchen ibuprofen (ADVIL,MOTRIN) 600 MG tablet Take 1 tablet (600 mg total) by mouth every 6 (six) hours. (Patient not taking: Reported on 01/24/2015) 30 tablet 1   No current facility-administered medications for this visit.    Review of Systems  Constitutional: negative Eyes: negative Ears, nose, mouth, throat, and face: negative Respiratory: positive for cough Cardiovascular: negative Gastrointestinal: negative Genitourinary:negative Integument/breast: negative Hematologic/lymphatic: negative Musculoskeletal:negative Neurological: negative Behavioral/Psych: negative Endocrine: negative Allergic/Immunologic: negative  Physical Exam  QBH:ALPFX, healthy, no distress, well nourished, well developed and anxious SKIN: skin color, texture, turgor are normal, no rashes or significant lesions HEAD: Normocephalic, No masses, lesions, tenderness or abnormalities EYES: normal, PERRLA EARS: External ears normal, Canals clear OROPHARYNX:no exudate, no erythema and lips, buccal mucosa, and tongue normal  NECK: supple, no adenopathy, no JVD LYMPH:  no palpable lymphadenopathy, no hepatosplenomegaly BREAST:not examined LUNGS: clear to auscultation , and palpation HEART: regular rate & rhythm and no murmurs ABDOMEN:abdomen soft, non-tender, normal bowel sounds and no masses or organomegaly BACK: Back symmetric, no curvature., No CVA tenderness EXTREMITIES:no joint deformities, effusion, or inflammation, no edema, no skin discoloration  NEURO: alert & oriented x 3 with fluent speech, no focal motor/sensory deficits  PERFORMANCE STATUS: ECOG 0  LABORATORY DATA: Lab Results  Component Value Date   WBC 6.9 01/24/2015   HGB 12.6 01/24/2015   HCT 39.1 01/24/2015   MCV 93.8 01/24/2015   PLT 313 01/24/2015      Chemistry      Component Value Date/Time   NA 142 01/24/2015 1105   K 3.8 01/24/2015 1105   CO2 26  01/24/2015 1105   BUN 10.5 01/24/2015 1105   CREATININE 0.8 01/24/2015 1105      Component Value Date/Time   CALCIUM 9.4 01/24/2015 1105   ALKPHOS 55 01/24/2015 1105   AST 15 01/24/2015 1105   ALT 26 01/24/2015 1105   BILITOT 0.31 01/24/2015 1105       RADIOGRAPHIC STUDIES: No results found.  ASSESSMENT: This is a very pleasant 39 years old white female with a family history of deep venous thrombosis and a recent hypercoagulable panel that showed elevated protein C activity. She is here today for evaluation and discussion of this abnormality. The patient is currently asymptomatic except for hot flashes and menorrhagia that most likely secondary to hormonal changes and history of fibroids.   PLAN: I had a lengthy discussion with the patient and her husband about her current condition. I explained to them that the concerning finding would be a deficiency of protein C rather than increased level. I explained to the patient that the increased level of protein C could be secondary to inflammatory process and it is not usually associated with any clotting disorders or increased bleeding issues. I recommended for the patient to continue routine observation and follow-up  by her primary care physician. I don't see a need to see the patient at regular basis but I'll be happy to see her in the future if needed. She was advised to call if she has any concerning symptoms. The patient voices understanding of current disease status and treatment options and is in agreement with the current care plan.  All questions were answered. The patient knows to call the clinic with any problems, questions or concerns. We can certainly see the patient much sooner if necessary.  Thank you so much for allowing me to participate in the care of [place]. I will continue to follow up the patient with you and assist in her care.  I spent 35 minutes counseling the patient face to face. The total time spent  in the appointment was 55 minutes.  Disclaimer: This note was dictated with voice recognition software. Similar sounding words can inadvertently be transcribed and may not be corrected upon review.   Renia Mikelson K. January 24, 2015, 12:16 PM

## 2015-01-31 NOTE — Telephone Encounter (Deleted)
na

## 2015-02-10 NOTE — Telephone Encounter (Signed)
No additional notes
